# Patient Record
Sex: Female | Born: 1990
Health system: Southern US, Community
[De-identification: ages and names within clinical notes are randomized; demographics above are authoritative.]

## PROBLEM LIST (undated history)

## (undated) DIAGNOSIS — R002 Palpitations: Secondary | ICD-10-CM

---

## 2011-10-06 ENCOUNTER — Emergency Department (HOSPITAL_COMMUNITY)
Admission: EM | Admit: 2011-10-06 | Discharge: 2011-10-06 | Disposition: A | Discharge status: Home / Self Care | Payer: BC Managed Care – PPO | Attending: Emergency Medicine | Admitting: Emergency Medicine

## 2011-10-06 ENCOUNTER — Encounter (HOSPITAL_COMMUNITY): Payer: Self-pay | Admitting: *Deleted

## 2011-10-06 DIAGNOSIS — Y998 Other external cause status: Secondary | ICD-10-CM | POA: Insufficient documentation

## 2011-10-06 DIAGNOSIS — S61409A Unspecified open wound of unspecified hand, initial encounter: Secondary | ICD-10-CM | POA: Insufficient documentation

## 2011-10-06 DIAGNOSIS — IMO0002 Reserved for concepts with insufficient information to code with codable children: Secondary | ICD-10-CM

## 2011-10-06 DIAGNOSIS — S40819A Abrasion of unspecified upper arm, initial encounter: Secondary | ICD-10-CM

## 2011-10-06 DIAGNOSIS — Y9351 Activity, roller skating (inline) and skateboarding: Secondary | ICD-10-CM | POA: Insufficient documentation

## 2011-10-06 DIAGNOSIS — S40219A Abrasion of unspecified shoulder, initial encounter: Secondary | ICD-10-CM

## 2011-10-06 MED ORDER — IBUPROFEN 800 MG PO TABS: 800.0000 mg | ORAL_TABLET | Freq: Three times a day (TID) | ORAL | Status: AC | PRN

## 2011-10-06 NOTE — ED Notes (Signed)
Abrasions and skin tear lightly cleaned and dressed.

## 2011-10-06 NOTE — ED Provider Notes (Signed)
History     CSN: [id number]  Arrival date & time 10/06/11  1747   First MD Initiated Contact with Patient 10/06/11 1935      Chief Complaint  Patient presents with  . Extremity Laceration    right hand  . Abrasion    Right face, right shoulder, right elbow, right wrist  . Fall    (Consider location/radiation/quality/duration/timing/severity/associated sxs/prior treatment) HPI The patient presents to the ER with abrasions to multiple sites from a fall from a skateboard that just happened prior to arrival. The patient denies LOC, visual changes, weakness, nausea, vomiting, chest pain, SOB, dizziness, or neck pain. The patient did not take any medications prior to arrival.  History reviewed. No pertinent past medical history.  History reviewed. No pertinent past surgical history.  History reviewed. No pertinent family history.  History  Substance Use Topics  . Smoking status: Never Smoker   . Smokeless tobacco: Never Used  . Alcohol Use: No    OB History    Grav Para Term Preterm Abortions TAB SAB Ect Mult Living                  Review of Systems All other systems negative except as documented in the HPI. All pertinent positives and negatives as reviewed in the HPI.  Allergies  Review of patient's allergies indicates no known allergies.  Home Medications   Current Outpatient Rx  Name Route Sig Dispense Refill  . ADULT MULTIVITAMIN W/MINERALS CH Oral Take 1 tablet by mouth daily.      BP 114/80  Pulse 104  Temp(Src) 98.7 F (37.1 C) (Oral)  Resp 18  Wt 120 lb (54.432 kg)  SpO2 99%  LMP 09/30/2011  Physical Exam  Nursing note and vitals reviewed. Constitutional: She appears well-developed and well-nourished.  HENT:  Head: Normocephalic and atraumatic.  Neck: Normal range of motion. Neck supple.  Cardiovascular: Normal rate and regular rhythm.   Pulmonary/Chest: Effort normal and breath sounds normal.  Musculoskeletal:       Cervical back: Normal.     Arms:      Hands: Skin: Skin is warm and dry. No erythema.    ED Course  Procedures (including critical care time)  The patient is advised that the skin flap will only be left in place as a biological dressing. The patient is advised to return here as needed.Follow up with her doctor as needed. Keep abrasions clean and dry.   MDM          Carlyle Dolly, PA-C 10/18/11 1258

## 2011-10-06 NOTE — ED Notes (Signed)
Pt from home with reports of falling off skateboard onto pavement while going down a hill resulting in multiple abrasions on right side. Pt reports hitting head with right facial abrasions noted but denies LOC. Skin tear to right palm also noted, bleeding is controlled but areas noted to be oozing serosanguinous fluid.

## 2011-10-06 NOTE — ED Notes (Signed)
Cleaned w/ NS, bacitracin applied to abrasions, sutures dressed w/ xeroform drsg.

## 2011-10-06 NOTE — Discharge Instructions (Signed)
Return here as needed. Keep wounds clean and dry. The skin on your palm will eventually come off after some healing has occurred.

## 2011-10-18 NOTE — ED Provider Notes (Signed)
Medical screening examination/treatment/procedure(s) were performed by non-physician practitioner and as supervising physician I was immediately available for consultation/collaboration.   Dayton Bailiff, MD 10/18/11 2253

## 2013-06-20 ENCOUNTER — Encounter (HOSPITAL_COMMUNITY): Payer: Self-pay | Admitting: Emergency Medicine

## 2013-06-20 ENCOUNTER — Emergency Department (HOSPITAL_COMMUNITY)
Admission: EM | Admit: 2013-06-20 | Discharge: 2013-06-21 | Disposition: A | Discharge status: Home / Self Care | Payer: BC Managed Care – PPO | Attending: Emergency Medicine | Admitting: Emergency Medicine

## 2013-06-20 DIAGNOSIS — A084 Viral intestinal infection, unspecified: Secondary | ICD-10-CM

## 2013-06-20 DIAGNOSIS — Z3202 Encounter for pregnancy test, result negative: Secondary | ICD-10-CM | POA: Insufficient documentation

## 2013-06-20 DIAGNOSIS — A088 Other specified intestinal infections: Secondary | ICD-10-CM | POA: Insufficient documentation

## 2013-06-20 LAB — CBC WITH DIFFERENTIAL/PLATELET
Basophils Absolute: 0 10*3/uL (ref 0.0–0.1)
Basophils Relative: 0 % (ref 0–1)
Eosinophils Absolute: 0 10*3/uL (ref 0.0–0.7)
Eosinophils Relative: 0 % (ref 0–5)
HCT: 39.8 % (ref 36.0–46.0)
Hemoglobin: 13.6 g/dL (ref 12.0–15.0)
Lymphocytes Relative: 11 % — ABNORMAL LOW (ref 12–46)
Lymphs Abs: 1 10*3/uL (ref 0.7–4.0)
MCH: 27.9 pg (ref 26.0–34.0)
MCHC: 34.2 g/dL (ref 30.0–36.0)
MCV: 81.6 fL (ref 78.0–100.0)
Monocytes Absolute: 0.9 10*3/uL (ref 0.1–1.0)
Monocytes Relative: 10 % (ref 3–12)
Neutro Abs: 7.1 10*3/uL (ref 1.7–7.7)
Neutrophils Relative %: 79 % — ABNORMAL HIGH (ref 43–77)
Platelets: 260 10*3/uL (ref 150–400)
RBC: 4.88 MIL/uL (ref 3.87–5.11)
RDW: 13 % (ref 11.5–15.5)
WBC: 9 10*3/uL (ref 4.0–10.5)

## 2013-06-20 LAB — COMPREHENSIVE METABOLIC PANEL
ALT: 13 U/L (ref 0–35)
AST: 16 U/L (ref 0–37)
Albumin: 5 g/dL (ref 3.5–5.2)
Alkaline Phosphatase: 56 U/L (ref 39–117)
BUN: 16 mg/dL (ref 6–23)
CO2: 25 mEq/L (ref 19–32)
Calcium: 10 mg/dL (ref 8.4–10.5)
Chloride: 100 mEq/L (ref 96–112)
Creatinine, Ser: 0.84 mg/dL (ref 0.50–1.10)
GFR calc Af Amer: >90 mL/min (ref >90)
GFR calc non Af Amer: >90 mL/min (ref >90)
Glucose, Bld: 98 mg/dL (ref 70–99)
Potassium: 3.9 mEq/L (ref 3.7–5.3)
Sodium: 141 mEq/L (ref 137–147)
Total Bilirubin: 0.4 mg/dL (ref 0.3–1.2)
Total Protein: 9.1 g/dL — ABNORMAL HIGH (ref 6.0–8.3)

## 2013-06-20 LAB — URINALYSIS, ROUTINE W REFLEX MICROSCOPIC
Bilirubin Urine: NEGATIVE
Glucose, UA: NEGATIVE mg/dL
Hgb urine dipstick: NEGATIVE
Ketones, ur: 40 mg/dL — AB
Leukocytes, UA: NEGATIVE
Nitrite: NEGATIVE
Protein, ur: NEGATIVE mg/dL
Specific Gravity, Urine: 1.029 (ref 1.005–1.030)
Urobilinogen, UA: 0.2 mg/dL (ref 0.0–1.0)
pH: 6 (ref 5.0–8.0)

## 2013-06-20 LAB — POC URINE PREG, ED: Preg Test, Ur: NEGATIVE

## 2013-06-20 MED ORDER — ONDANSETRON 8 MG PO TBDP
8.0000 mg | ORAL_TABLET | Freq: Once | ORAL | Status: AC
  Administered 2013-06-20: 8 mg via ORAL
  Filled 2013-06-20: qty 1

## 2013-06-20 MED ORDER — SODIUM CHLORIDE 0.9 % IV BOLUS (SEPSIS)
500.0000 mL | Freq: Once | INTRAVENOUS | Status: AC
  Administered 2013-06-20: 500 mL via INTRAVENOUS

## 2013-06-20 NOTE — ED Notes (Signed)
Pt reports acute onset of n/v/d that began approx 0100 today - pt reports chills this a.m. As well. Pt admits to abd cramping - LNMP 06/03/2013 - pt attributes her symptoms to possible food poisoning.

## 2013-06-20 NOTE — ED Provider Notes (Signed)
CSN: [id number]     Arrival date & time 06/20/13  2120 History   First MD Initiated Contact with Patient 06/20/13 2316     Chief Complaint  Patient presents with  . Nausea  . Emesis  . Diarrhea     (Consider location/radiation/quality/duration/timing/severity/associated sxs/prior Treatment) HPI Comments: Pt is a 23 y/o healthy female who presents to the ED complaining of sudden onset nausea, vomiting and diarrhea around 1:00 am today. States she had multiple episodes of non-bloody emesis until around noon, was able to keep some food down at that time. She had multiple episodes of non-bloody diarrhea until 8:00 pm tonight. Admits to lower abdominal pain described as cramping, subjective fever and chills. Thinks she may have had food poisoning last night after eating [employer]. LMP was on 2/5 and was normal. Denies increased urinary frequency, urgency or dysuria, vaginal bleeding or discharge.  Patient is a 23 y.o. female presenting with vomiting and diarrhea. The history is provided by the patient.  Emesis Associated symptoms: abdominal pain, chills and diarrhea   Diarrhea Associated symptoms: abdominal pain, chills, fever and vomiting     History reviewed. No pertinent past medical history. History reviewed. No pertinent past surgical history. History reviewed. No pertinent family history. History  Substance Use Topics  . Smoking status: Never Smoker   . Smokeless tobacco: Never Used  . Alcohol Use: No   OB History   Grav Para Term Preterm Abortions TAB SAB Ect Mult Living                 Review of Systems  Constitutional: Positive for fever and chills.  Gastrointestinal: Positive for nausea, vomiting, abdominal pain and diarrhea.  All other systems reviewed and are negative.      Allergies  Review of patient's allergies indicates no known allergies.  Home Medications  No current outpatient prescriptions on file. BP 115/76  Pulse 93  Temp(Src) 98.4 F (36.9 C)  (Oral)  Resp 17  Ht 5\' 4"  (1.626 m)  Wt 121 lb (54.885 kg)  BMI 20.76 kg/m2  SpO2 100%  LMP 06/03/2013 Physical Exam  Nursing note and vitals reviewed. Constitutional: She is oriented to person, place, and time. She appears well-developed and well-nourished. No distress.  HENT:  Head: Normocephalic and atraumatic.  Mouth/Throat: Oropharynx is clear and moist.  Eyes: Conjunctivae are normal.  Neck: Normal range of motion. Neck supple.  Cardiovascular: Normal rate, regular rhythm and normal heart sounds.   Pulmonary/Chest: Effort normal and breath sounds normal.  Abdominal: Soft. Normal appearance and bowel sounds are normal. She exhibits no distension. There is tenderness (mild). There is no rigidity, no rebound and no guarding.    No peritoneal signs.  Musculoskeletal: Normal range of motion. She exhibits no edema.  Neurological: She is alert and oriented to person, place, and time.  Skin: Skin is warm and dry. She is not diaphoretic.  Psychiatric: She has a normal mood and affect. Her behavior is normal.    ED Course  Procedures (including critical care time) Labs Review Labs Reviewed  CBC WITH DIFFERENTIAL - Abnormal; Notable for the following:    Neutrophils Relative % 79 (*)    Lymphocytes Relative 11 (*)    All other components within normal limits  COMPREHENSIVE METABOLIC PANEL - Abnormal; Notable for the following:    Total Protein 9.1 (*)    All other components within normal limits  URINALYSIS, ROUTINE W REFLEX MICROSCOPIC - Abnormal; Notable for the following:    Ketones,  ur 40 (*)    All other components within normal limits  POC URINE PREG, ED   Imaging Review No results found.  EKG Interpretation   None       MDM   Final diagnoses:  Viral gastroenteritis   Pt presenting with n/v/d after eating [employer]. She is well appearing and in NAD, VSS. Labs obtained in triage prior to pt being seen, ketones in urine evident of mild dehydration. After  receiving 500 cc bolus, pt reports she is feeling much better. Tolerating PO. Repeat abdominal exam still mildly tender, however clinical improvement noted. Stable for d/c. Return precautions given. Patient states understanding of treatment care plan and is agreeable.   Illene Labrador, PA-C 06/21/13 [phone]

## 2013-06-21 NOTE — Discharge Instructions (Signed)
Viral Gastroenteritis Viral gastroenteritis is also known as stomach flu. This condition affects the stomach and intestinal tract. It can cause sudden diarrhea and vomiting. The illness typically lasts 3 to 8 days. Most people develop an immune response that eventually gets rid of the virus. While this natural response develops, the virus can make you quite ill. CAUSES  Many different viruses can cause gastroenteritis, such as rotavirus or noroviruses. You can catch one of these viruses by consuming contaminated food or water. You may also catch a virus by sharing utensils or other personal items with an infected person or by touching a contaminated surface. SYMPTOMS  The most common symptoms are diarrhea and vomiting. These problems can cause a severe loss of body fluids (dehydration) and a body salt (electrolyte) imbalance. Other symptoms may include:  Fever.  Headache.  Fatigue.  Abdominal pain. DIAGNOSIS  Your caregiver can usually diagnose viral gastroenteritis based on your symptoms and a physical exam. A stool sample may also be taken to test for the presence of viruses or other infections. TREATMENT  This illness typically goes away on its own. Treatments are aimed at rehydration. The most serious cases of viral gastroenteritis involve vomiting so severely that you are not able to keep fluids down. In these cases, fluids must be given through an intravenous line (IV). HOME CARE INSTRUCTIONS   Drink enough fluids to keep your urine clear or pale yellow. Drink small amounts of fluids frequently and increase the amounts as tolerated.  Ask your caregiver for specific rehydration instructions.  Avoid:  Foods high in sugar.  Alcohol.  Carbonated drinks.  Tobacco.  Juice.  Caffeine drinks.  Extremely hot or cold fluids.  Fatty, greasy foods.  Too much intake of anything at one time.  Dairy products until 24 to 48 hours after diarrhea stops.  You may consume probiotics.  Probiotics are active cultures of beneficial bacteria. They may lessen the amount and number of diarrheal stools in adults. Probiotics can be found in yogurt with active cultures and in supplements.  Wash your hands well to avoid spreading the virus.  Only take over-the-counter or prescription medicines for pain, discomfort, or fever as directed by your caregiver. Do not give aspirin to children. Antidiarrheal medicines are not recommended.  Ask your caregiver if you should continue to take your regular prescribed and over-the-counter medicines.  Keep all follow-up appointments as directed by your caregiver. SEEK IMMEDIATE MEDICAL CARE IF:   You are unable to keep fluids down.  You do not urinate at least once every 6 to 8 hours.  You develop shortness of breath.  You notice blood in your stool or vomit. This may look like coffee grounds.  You have abdominal pain that increases or is concentrated in one small area (localized).  You have persistent vomiting or diarrhea.  You have a fever.  The patient is a child younger than 3 months, and he or she has a fever.  The patient is a child older than 3 months, and he or she has a fever and persistent symptoms.  The patient is a child older than 3 months, and he or she has a fever and symptoms suddenly get worse.  The patient is a baby, and he or she has no tears when crying. MAKE SURE YOU:   Understand these instructions.  Will watch your condition.  Will get help right away if you are not doing well or get worse. Document Released: 04/15/2005 Document Revised: 07/08/2011 Document Reviewed: 01/30/2011   [employer] Patient Information 2014 [employer].  

## 2013-06-21 NOTE — ED Provider Notes (Signed)
Medical screening examination/treatment/procedure(s) were performed by non-physician practitioner and as supervising physician I was immediately available for consultation/collaboration.  EKG Interpretation   None         Hoy Morn, MD 06/21/13 [phone]

## 2014-02-24 ENCOUNTER — Emergency Department (HOSPITAL_COMMUNITY): Payer: BC Managed Care – PPO

## 2014-02-24 ENCOUNTER — Emergency Department (HOSPITAL_COMMUNITY)
Admission: EM | Admit: 2014-02-24 | Discharge: 2014-02-25 | Disposition: A | Discharge status: Home / Self Care | Payer: BC Managed Care – PPO | Attending: Emergency Medicine | Admitting: Emergency Medicine

## 2014-02-24 ENCOUNTER — Encounter (HOSPITAL_COMMUNITY): Payer: Self-pay | Admitting: Emergency Medicine

## 2014-02-24 DIAGNOSIS — R002 Palpitations: Secondary | ICD-10-CM | POA: Insufficient documentation

## 2014-02-24 DIAGNOSIS — R0789 Other chest pain: Secondary | ICD-10-CM | POA: Diagnosis not present

## 2014-02-24 DIAGNOSIS — Z3202 Encounter for pregnancy test, result negative: Secondary | ICD-10-CM | POA: Diagnosis not present

## 2014-02-24 HISTORY — DX: Palpitations: R00.2

## 2014-02-24 LAB — BASIC METABOLIC PANEL
Anion gap: 10 (ref 5–15)
BUN: 14 mg/dL (ref 6–23)
CO2: 28 mEq/L (ref 19–32)
Calcium: 9.3 mg/dL (ref 8.4–10.5)
Chloride: 100 mEq/L (ref 96–112)
Creatinine, Ser: 0.92 mg/dL (ref 0.50–1.10)
GFR calc Af Amer: >90 mL/min (ref >90)
GFR calc non Af Amer: 88 mL/min — ABNORMAL LOW (ref >90)
Glucose, Bld: 90 mg/dL (ref 70–99)
Potassium: 3.7 mEq/L (ref 3.7–5.3)
Sodium: 138 mEq/L (ref 137–147)

## 2014-02-24 LAB — CBC
HCT: 36.8 % (ref 36.0–46.0)
Hemoglobin: 12 g/dL (ref 12.0–15.0)
MCH: 27.1 pg (ref 26.0–34.0)
MCHC: 32.6 g/dL (ref 30.0–36.0)
MCV: 83.3 fL (ref 78.0–100.0)
Platelets: 223 10*3/uL (ref 150–400)
RBC: 4.42 MIL/uL (ref 3.87–5.11)
RDW: 13.3 % (ref 11.5–15.5)
WBC: 5 10*3/uL (ref 4.0–10.5)

## 2014-02-24 LAB — POC URINE PREG, ED: Preg Test, Ur: NEGATIVE

## 2014-02-24 LAB — I-STAT TROPONIN, ED: Troponin i, poc: 0.01 ng/mL (ref 0.00–0.08)

## 2014-02-24 NOTE — ED Provider Notes (Signed)
CSN: [id number]     Arrival date & time 02/24/14  2135 History   First MD Initiated Contact with Patient 02/24/14 2305     Chief Complaint  Patient presents with  . Palpitations    with central chest tightness     (Consider location/radiation/quality/duration/timing/severity/associated sxs/prior Treatment) HPI 23 yo female presents to the ER with complaint of palpitations.  She reports she has had palpitations on and off for about 4 years, but tonight had some chest tightness with the palpitations and the sensation of a racing heart.  She reports that she had just finished eating chicken tenderness at [employer] when symptoms came on.  They lasted just a few seconds.  She has a setback at this time aside from occasionally having an extra beat sensation.  Patient reports she usually has palpitations immediately after eating greasy or fried foods.  She does not have these symptoms when she eats healthy foods.  She does not normally have a fast heart rate or chest tightness.  She denies any leg swelling.  No prolonged immobilization, no exogenous hormones.  She is not a smoker.  Patient takes no medications, no medical history, no family history.  She does not ingest any caffeine. Past Medical History  Diagnosis Date  . Palpitations    History reviewed. No pertinent past surgical history. No family history on file. History  Substance Use Topics  . Smoking status: Never Smoker   . Smokeless tobacco: Never Used  . Alcohol Use: No     Comment: occ   OB History   Grav Para Term Preterm Abortions TAB SAB Ect Mult Living                 Review of Systems   See History of Present Illness; otherwise all other systems are reviewed and negative  Allergies  Review of patient's allergies indicates no known allergies.  Home Medications   Prior to Admission medications   Not on File   BP 130/64  Pulse 82  Temp(Src) 98.4 F (36.9 C) (Oral)  Resp 18  SpO2 100%  LMP 02/17/2014 Physical  Exam  Nursing note and vitals reviewed. Constitutional: She is oriented to person, place, and time. She appears well-developed and well-nourished.  HENT:  Head: Normocephalic and atraumatic.  Nose: Nose normal.  Mouth/Throat: Oropharynx is clear and moist.  Eyes: Conjunctivae and EOM are normal. Pupils are equal, round, and reactive to light.  Neck: Normal range of motion. Neck supple. No JVD present. No tracheal deviation present. No thyromegaly present.  Cardiovascular: Normal rate, regular rhythm, normal heart sounds and intact distal pulses.  Exam reveals no gallop and no friction rub.   No murmur heard. Pulmonary/Chest: Effort normal and breath sounds normal. No stridor. No respiratory distress. She has no wheezes. She has no rales. She exhibits no tenderness.  Abdominal: Soft. Bowel sounds are normal. She exhibits no distension and no mass. There is no tenderness. There is no rebound and no guarding.  Musculoskeletal: Normal range of motion. She exhibits no edema and no tenderness.  Lymphadenopathy:    She has no cervical adenopathy.  Neurological: She is alert and oriented to person, place, and time. She displays normal reflexes. She exhibits normal muscle tone. Coordination normal.  Skin: Skin is warm and dry. No rash noted. No erythema. No pallor.  Psychiatric: She has a normal mood and affect. Her behavior is normal. Judgment and thought content normal.    ED Course  Procedures (including critical care time)  Labs Review Labs Reviewed  BASIC METABOLIC PANEL - Abnormal; Notable for the following:    GFR calc non Af Amer 88 (*)    All other components within normal limits  CBC  I-STAT TROPOININ, ED  POC URINE PREG, ED    Imaging Review Dg Chest 2 View  02/24/2014   CLINICAL DATA:  Palpitations, chest tightness, shortness of breath  EXAM: CHEST  2 VIEW  COMPARISON:  None.  FINDINGS: The heart size and mediastinal contours are within normal limits. Both lungs are clear. The  visualized skeletal structures are unremarkable.  IMPRESSION: No active cardiopulmonary disease.   Electronically Signed   By: Kathreen Devoid   On: 02/24/2014 22:37     EKG Interpretation   Date/Time:  Thursday February 24 2014 21:41:01 EDT Ventricular Rate:  85 PR Interval:  146 QRS Duration: 87 QT Interval:  340 QTC Calculation: 404 R Axis:   57 Text Interpretation:  Sinus rhythm RSR' in V1 or V2, right VCD or RVH  Nonspecific T abnormalities, anterior leads No old tracing to compare  Confirmed by Almendra Loria  MD, Tavaughn Silguero ([id number]) on 02/24/2014 11:08:24 PM      MDM   Final diagnoses:  Heart palpitations    23 year old female with palpitations.  Workup here unremarkable.  Advise patient that she should avoid foods that trigger her palpitations if they are an annoyance.  Should they increase or worsen, I have given her follow-up information with cardiology as needed.  I have suggested that she try Zantac or Pepcid to see if that helps with her symptoms.    Kalman Drape, MD 02/25/14 [phone]

## 2014-02-24 NOTE — ED Notes (Addendum)
Patient c/o palpitations with central chest tightness. Patient report history of palpitations, but never with chest pain, states this is difference. VSS. Patient denies taking any medications for this problem PTA. Patient states she was eating when her symptoms started ([employer]).

## 2014-02-24 NOTE — Discharge Instructions (Signed)
You can try over the counter acid reducing medication such as zantac or pepcid if you notice your symptoms are increasing when you are eating/after you eat.  Follow up with your doctor or with the cardiology office listed above.     Palpitations A palpitation is the feeling that your heartbeat is irregular or is faster than normal. It may feel like your heart is fluttering or skipping a beat. Palpitations are usually not a serious problem. However, in some cases, you may need further medical evaluation. CAUSES  Palpitations can be caused by:  Smoking.  Caffeine or other stimulants, such as diet pills or energy drinks.  Alcohol.  Stress and anxiety.  Strenuous physical activity.  Fatigue.  Certain medicines.  Heart disease, especially if you have a history of irregular heart rhythms (arrhythmias), such as atrial fibrillation, atrial flutter, or supraventricular tachycardia.  An improperly working pacemaker or defibrillator. DIAGNOSIS  To find the cause of your palpitations, your health care provider will take your medical history and perform a physical exam. Your health care provider may also have you take a test called an ambulatory electrocardiogram (ECG). An ECG records your heartbeat patterns over a 24-hour period. You may also have other tests, such as:  Transthoracic echocardiogram (TTE). During echocardiography, sound waves are used to evaluate how blood flows through your heart.  Transesophageal echocardiogram (TEE).  Cardiac monitoring. This allows your health care provider to monitor your heart rate and rhythm in real time.  Holter monitor. This is a portable device that records your heartbeat and can help diagnose heart arrhythmias. It allows your health care provider to track your heart activity for several days, if needed.  Stress tests by exercise or by giving medicine that makes the heart beat faster. TREATMENT  Treatment of palpitations depends on the cause of  your symptoms and can vary greatly. Most cases of palpitations do not require any treatment other than time, relaxation, and monitoring your symptoms. Other causes, such as atrial fibrillation, atrial flutter, or supraventricular tachycardia, usually require further treatment. HOME CARE INSTRUCTIONS   Avoid:  Caffeinated coffee, tea, soft drinks, diet pills, and energy drinks.  Chocolate.  Alcohol.  Stop smoking if you smoke.  Reduce your stress and anxiety. Things that can help you relax include:  A method of controlling things in your body, such as your heartbeats, with your mind (biofeedback).  Yoga.  Meditation.  Physical activity such as swimming, jogging, or walking.  Get plenty of rest and sleep. SEEK MEDICAL CARE IF:   You continue to have a fast or irregular heartbeat beyond 24 hours.  Your palpitations occur more often. SEEK IMMEDIATE MEDICAL CARE IF:  You have chest pain or shortness of breath.  You have a severe headache.  You feel dizzy or you faint. MAKE SURE YOU:  Understand these instructions.  Will watch your condition.  Will get help right away if you are not doing well or get worse. Document Released: 04/12/2000 Document Revised: 04/20/2013 Document Reviewed: 06/14/2011 [facility] Patient Information 2015 [place], [place]. This information is not intended to replace advice given to you by your health care provider. Make sure you discuss any questions you have with your health care provider.

## 2014-07-25 ENCOUNTER — Encounter (HOSPITAL_COMMUNITY): Payer: Self-pay | Admitting: Emergency Medicine

## 2014-07-25 ENCOUNTER — Emergency Department (HOSPITAL_COMMUNITY)
Admission: EM | Admit: 2014-07-25 | Discharge: 2014-07-25 | Disposition: A | Discharge status: Home / Self Care | Payer: BLUE CROSS/BLUE SHIELD | Attending: Emergency Medicine | Admitting: Emergency Medicine

## 2014-07-25 DIAGNOSIS — J029 Acute pharyngitis, unspecified: Secondary | ICD-10-CM | POA: Insufficient documentation

## 2014-07-25 DIAGNOSIS — R52 Pain, unspecified: Secondary | ICD-10-CM | POA: Insufficient documentation

## 2014-07-25 LAB — RAPID STREP SCREEN (MED CTR MEBANE ONLY): Streptococcus, Group A Screen (Direct): NEGATIVE

## 2014-07-25 MED ORDER — ACETAMINOPHEN 500 MG PO TABS: 500.0000 mg | ORAL_TABLET | Freq: Four times a day (QID) | ORAL | Status: DC | PRN

## 2014-07-25 MED ORDER — IBUPROFEN 600 MG PO TABS: 600.0000 mg | ORAL_TABLET | Freq: Four times a day (QID) | ORAL | Status: DC | PRN

## 2014-07-25 NOTE — ED Notes (Signed)
Pt c/o sore throat, pt first checked in asking for doctors note but was told unable to just give out doctors note.

## 2014-07-25 NOTE — ED Provider Notes (Signed)
CSN: [id number]     Arrival date & time 07/25/14  1013 History   First MD Initiated Contact with Patient 07/25/14 1020     Chief Complaint  Patient presents with  . Sore Throat     (Consider location/radiation/quality/duration/timing/severity/associated sxs/prior Treatment) HPI  Pt is a 24yo female c/o gradually worsening sore throat that started 2 days ago with associated nausea, body aches, and subjective fever with hot and cold chills. Pt states throat pain is 6/10 at worse. She has taken benadryl w/o relief.  Pt denies taking tylenol or motrin for pain.  Denies difficulty breathing or swallowing. Reports mild nasal congestion but denies cough or SOB.  Denies sick contacts or recent travel. No other significant PMH.   Past Medical History  Diagnosis Date  . Palpitations    History reviewed. No pertinent past surgical history. No family history on file. History  Substance Use Topics  . Smoking status: Never Smoker   . Smokeless tobacco: Never Used  . Alcohol Use: No     Comment: occ   OB History    No data available     Review of Systems  Constitutional: Positive for fever (subjective) and chills. Negative for appetite change.  HENT: Positive for congestion ( mild) and sore throat. Negative for ear pain, facial swelling, trouble swallowing and voice change.   Respiratory: Negative for cough and shortness of breath.   Cardiovascular: Negative for chest pain, palpitations and leg swelling.  Gastrointestinal: Positive for nausea. Negative for vomiting, abdominal pain and diarrhea.  Musculoskeletal: Positive for myalgias. Negative for back pain and arthralgias.  All other systems reviewed and are negative.     Allergies  Review of patient's allergies indicates no known allergies.  Home Medications   Prior to Admission medications   Medication Sig Start Date End Date Taking? Authorizing Provider  acetaminophen (TYLENOL) 500 MG tablet Take 1 tablet (500 mg total) by mouth  every 6 (six) hours as needed. 07/25/14   Noland Fordyce, PA-C  ibuprofen (ADVIL,MOTRIN) 600 MG tablet Take 1 tablet (600 mg total) by mouth every 6 (six) hours as needed. 07/25/14   Noland Fordyce, PA-C   BP 109/76 mmHg  Pulse 78  Temp(Src) 98.7 F (37.1 C) (Oral)  Resp 16  SpO2 97% Physical Exam  Constitutional: She appears well-developed and well-nourished. No distress.  HENT:  Head: Normocephalic and atraumatic.  Right Ear: Hearing, tympanic membrane, external ear and ear canal normal.  Left Ear: Hearing, tympanic membrane, external ear and ear canal normal.  Nose: Nose normal. Right sinus exhibits no maxillary sinus tenderness and no frontal sinus tenderness. Left sinus exhibits no maxillary sinus tenderness and no frontal sinus tenderness.  Mouth/Throat: Uvula is midline and mucous membranes are normal. No trismus in the jaw. Posterior oropharyngeal edema ( mild, bilaterally) and posterior oropharyngeal erythema present. No oropharyngeal exudate or tonsillar abscesses.  Eyes: Conjunctivae are normal. No scleral icterus.  Neck: Normal range of motion. Neck supple.  Cardiovascular: Normal rate, regular rhythm and normal heart sounds.   Pulmonary/Chest: Effort normal and breath sounds normal. No stridor. No respiratory distress. She has no wheezes. She has no rales. She exhibits no tenderness.  Abdominal: Soft. Bowel sounds are normal. She exhibits no distension and no mass. There is no tenderness. There is no rebound and no guarding.  Musculoskeletal: Normal range of motion.  Lymphadenopathy:    She has no cervical adenopathy.  Neurological: She is alert.  Skin: Skin is warm and dry. She is not  diaphoretic.  Nursing note and vitals reviewed.   ED Course  Procedures (including critical care time) Labs Review Labs Reviewed  RAPID STREP SCREEN  CULTURE, GROUP A STREP    Imaging Review No results found.   EKG Interpretation None      MDM   Final diagnoses:  Sore throat   Body aches    Pt is a 25yo female c/o a sore throat, myalgias, nausea, and subjective fever. No SOB. No difficulty breathing or swallowing.  Pt does have mild bilateral tonsillar edema with erythema, no exudates. No tonsillar abscess present.  Rapid strep: negative Culture pending. Pt discharged home with home care instructions. Advised ptto use acetaminophen and ibuprofen as needed for fever and pain. Encouraged rest and fluids. Return precautions provided. Pt verbalized understanding and agreement with tx plan.       Noland Fordyce, PA-C 07/25/14 1132  Milton Ferguson, MD 07/26/14 [phone]

## 2014-07-25 NOTE — ED Notes (Signed)
Bed: WTR6 Expected date:  Expected time:  Means of arrival:  Comments: 

## 2014-07-27 LAB — CULTURE, GROUP A STREP: Strep A Culture: NEGATIVE

## 2014-09-13 ENCOUNTER — Encounter (HOSPITAL_COMMUNITY): Payer: Self-pay

## 2014-09-13 ENCOUNTER — Emergency Department (HOSPITAL_COMMUNITY)
Admission: EM | Admit: 2014-09-13 | Discharge: 2014-09-13 | Discharge status: Against Medical Advice | Payer: BLUE CROSS/BLUE SHIELD | Attending: Emergency Medicine | Admitting: Emergency Medicine

## 2014-09-13 DIAGNOSIS — R197 Diarrhea, unspecified: Secondary | ICD-10-CM | POA: Diagnosis not present

## 2014-09-13 DIAGNOSIS — R079 Chest pain, unspecified: Secondary | ICD-10-CM | POA: Diagnosis not present

## 2014-09-13 DIAGNOSIS — R0602 Shortness of breath: Secondary | ICD-10-CM | POA: Insufficient documentation

## 2014-09-13 NOTE — ED Notes (Addendum)
Pt presents with c/o chest pain and shortness of breath. Pt reports this started when she was laying down approx one hour ago and she felt like her heart was beating fast. Pt's HR 76 in triage at this time. Pt also reports 2 episodes of diarrhea.

## 2014-09-13 NOTE — ED Notes (Signed)
Patient states she does not wish to wait any longer. States she is leaving.

## 2015-06-15 IMAGING — CR DG CHEST 2V
2 series · 2 of 2 positions shown · non-contrast
Comparison: None.

CLINICAL DATA: Palpitations, chest tightness, shortness of breath

EXAM:
CHEST  2 VIEW

[w chest pa]
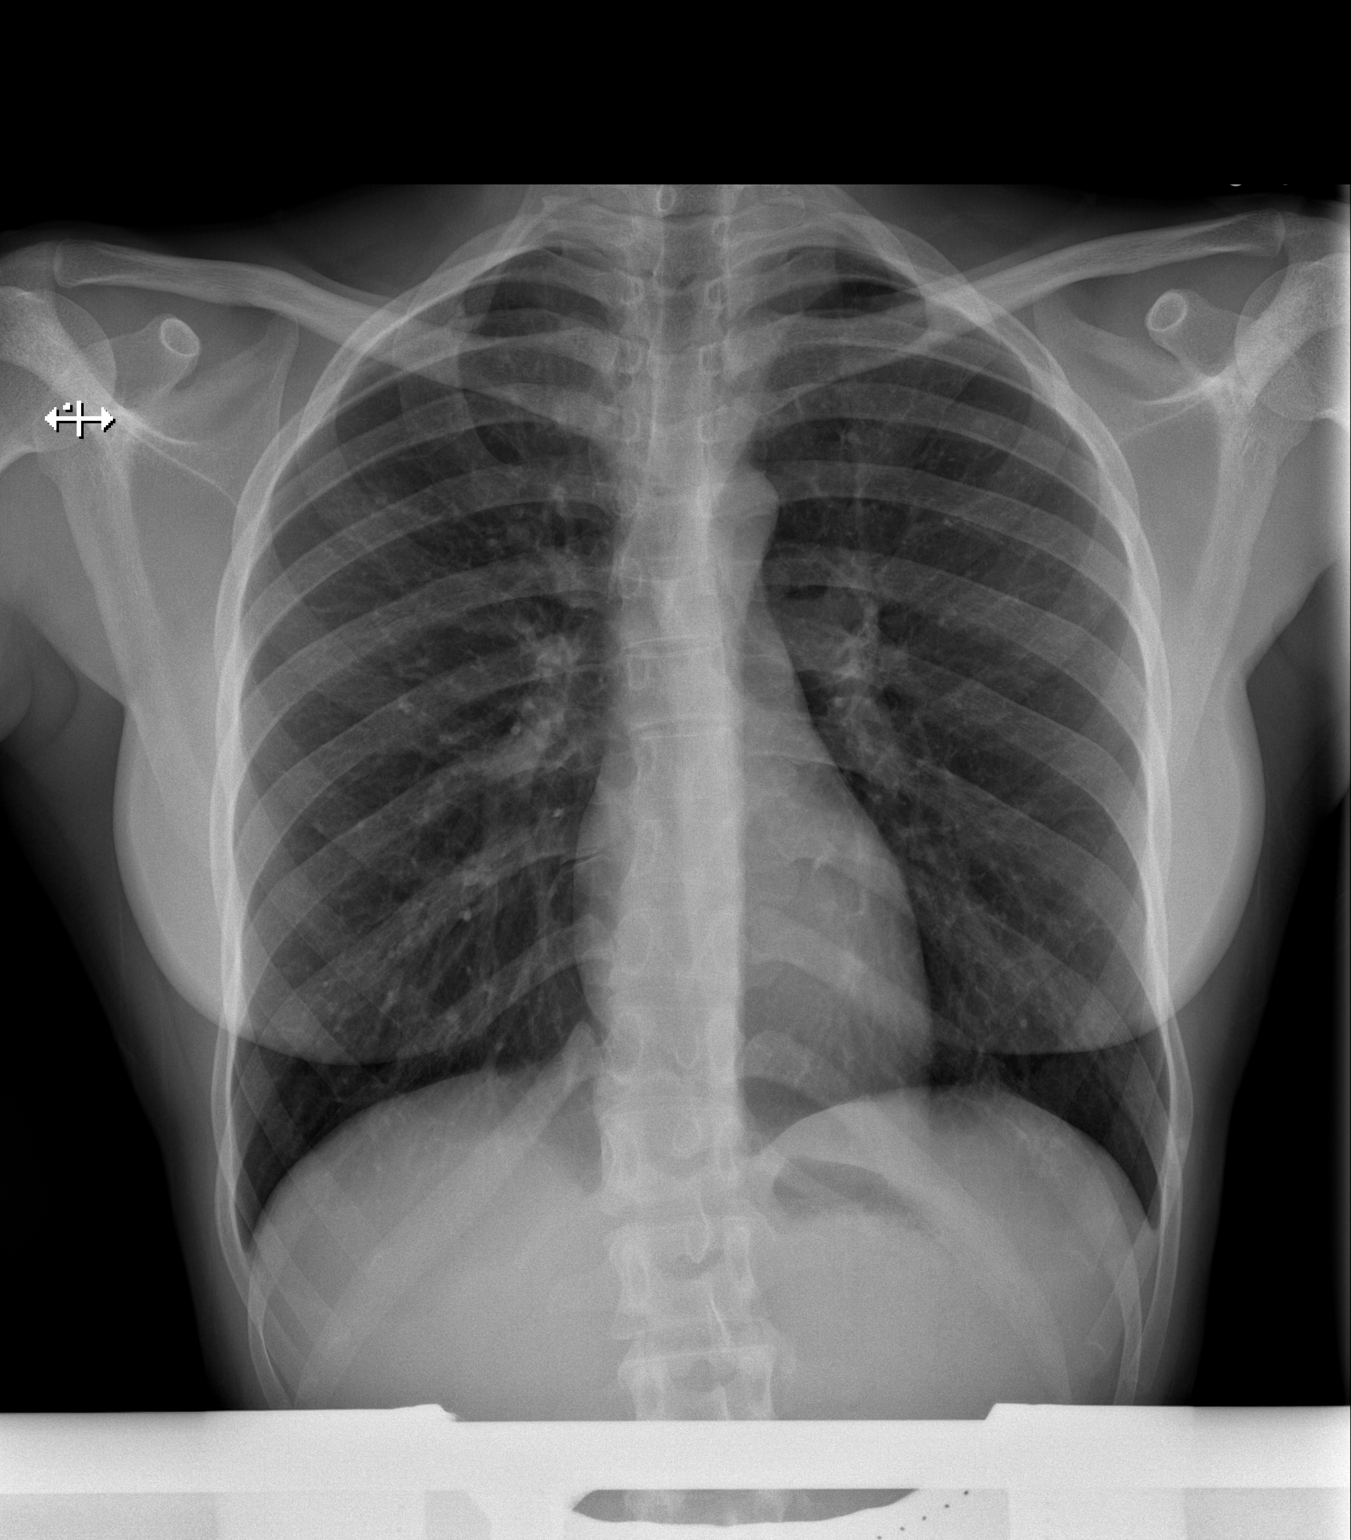

[w chest lat]
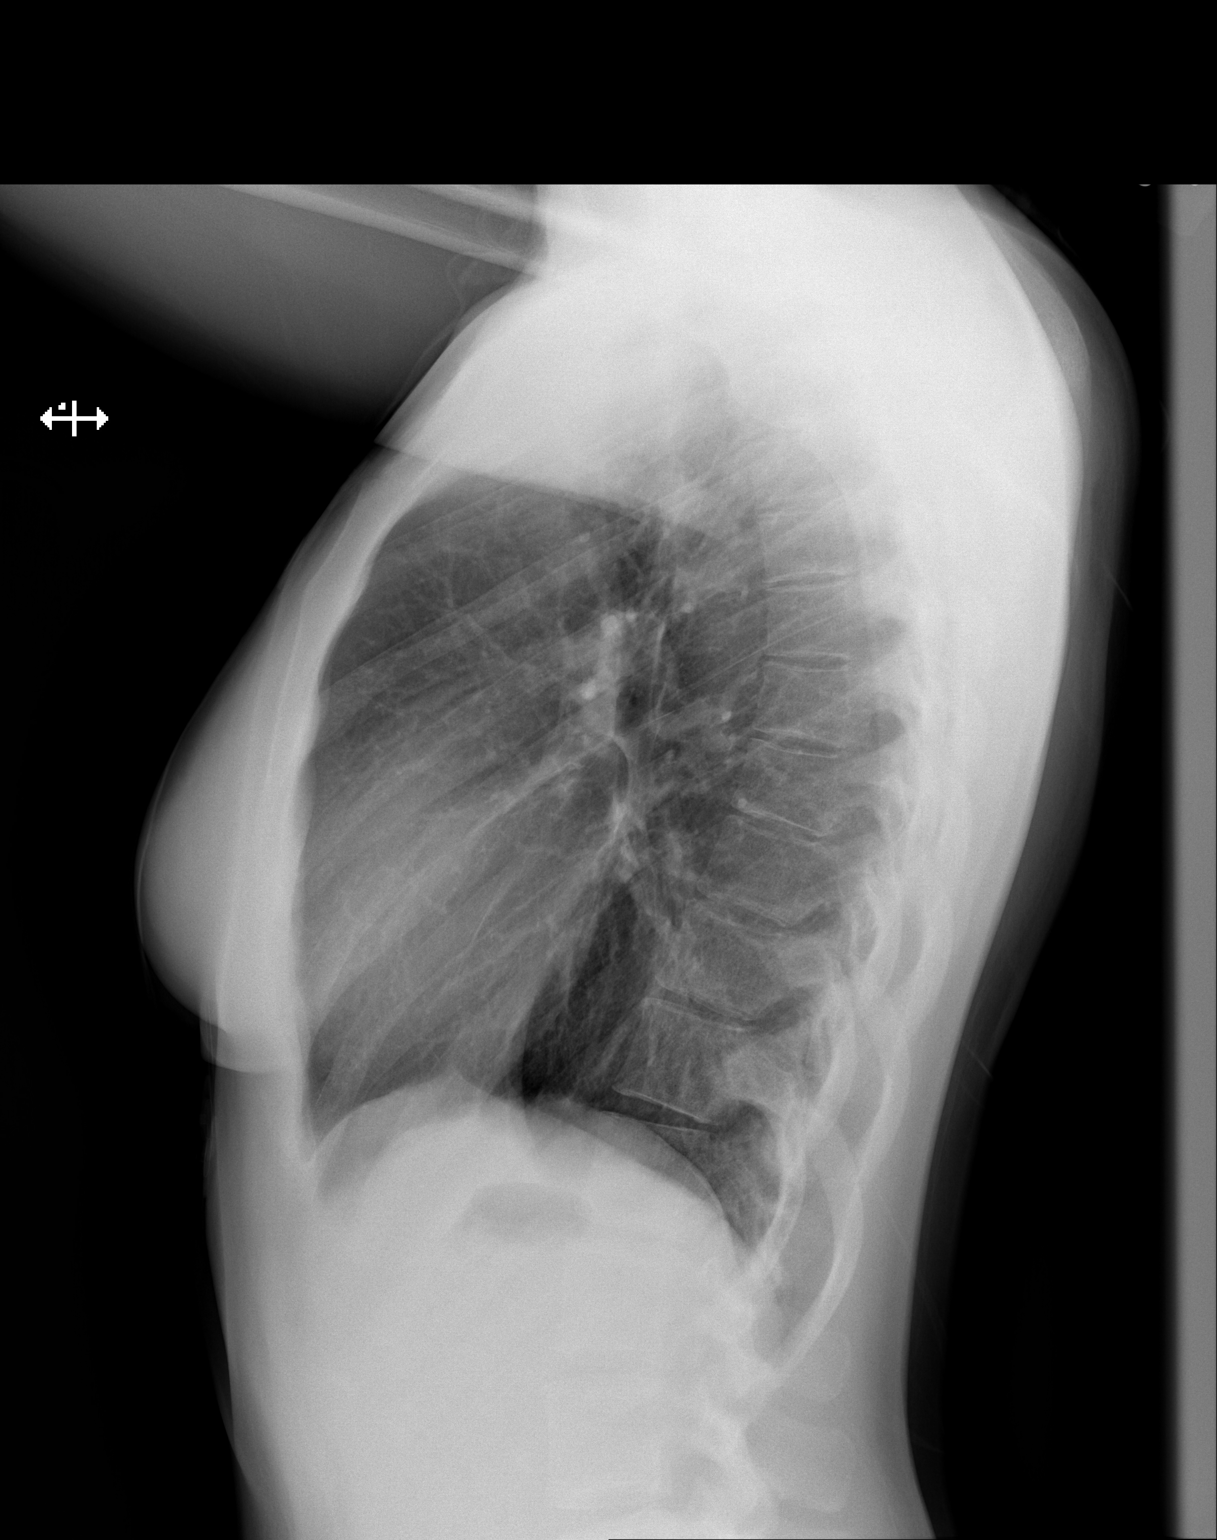

[2 of 2 positions shown; findings below may reference images not displayed]

FINDINGS: The heart size and mediastinal contours are within normal limits.
Both lungs are clear. The visualized skeletal structures are
unremarkable.
IMPRESSION: No active cardiopulmonary disease.

## 2017-02-10 DIAGNOSIS — L700 Acne vulgaris: Secondary | ICD-10-CM | POA: Diagnosis not present

## 2017-08-06 DIAGNOSIS — Z13228 Encounter for screening for other metabolic disorders: Secondary | ICD-10-CM | POA: Diagnosis not present

## 2017-08-06 DIAGNOSIS — K219 Gastro-esophageal reflux disease without esophagitis: Secondary | ICD-10-CM | POA: Diagnosis not present

## 2017-08-06 LAB — BASIC METABOLIC PANEL
BUN: 13 (ref 4–21)
Creatinine: 0.9 (ref <=1.1)
Glucose: 76
Potassium: 4.8 (ref 3.4–5.3)
Sodium: 141 (ref 137–147)

## 2017-08-06 LAB — CBC AND DIFFERENTIAL
HCT: 39 (ref 36–46)
Hemoglobin: 12.5 (ref 12.0–16.0)
Platelets: 238 (ref 150–399)

## 2017-08-06 LAB — VITAMIN D 25 HYDROXY (VIT D DEFICIENCY, FRACTURES): Vit D, 25-Hydroxy: 23.4

## 2017-08-06 LAB — TSH: TSH: 0.89 (ref <=5.90)

## 2017-08-06 LAB — LIPID PANEL
Cholesterol: 135 (ref 0–200)
HDL: 66 (ref 35–70)
LDL Cholesterol: 59
LDl/HDL Ratio: 0.9
Triglycerides: 51 (ref 40–160)

## 2018-02-03 ENCOUNTER — Encounter: Payer: Self-pay | Admitting: Nurse Practitioner

## 2018-02-03 DIAGNOSIS — K219 Gastro-esophageal reflux disease without esophagitis: Secondary | ICD-10-CM

## 2018-02-11 ENCOUNTER — Ambulatory Visit: Payer: BLUE CROSS/BLUE SHIELD | Admitting: Nurse Practitioner

## 2018-02-11 ENCOUNTER — Encounter: Payer: Self-pay | Admitting: Nurse Practitioner

## 2018-02-11 VITALS — BP 122/78 | HR 62 | Temp 98.1°F | Ht 63.25 in | Wt 125.4 lb

## 2018-02-11 DIAGNOSIS — E559 Vitamin D deficiency, unspecified: Secondary | ICD-10-CM

## 2018-02-11 DIAGNOSIS — Z0000 Encounter for general adult medical examination without abnormal findings: Secondary | ICD-10-CM

## 2018-02-11 DIAGNOSIS — Z87898 Personal history of other specified conditions: Secondary | ICD-10-CM

## 2018-02-11 DIAGNOSIS — R002 Palpitations: Secondary | ICD-10-CM | POA: Diagnosis not present

## 2018-02-11 LAB — POCT URINALYSIS DIPSTICK
Bilirubin, UA: NEGATIVE
Blood, UA: NEGATIVE
Glucose, UA: NEGATIVE
Ketones, UA: NEGATIVE
Leukocytes, UA: NEGATIVE
Nitrite, UA: NEGATIVE
Protein, UA: NEGATIVE
Spec Grav, UA: 1.015 (ref 1.010–1.025)
Urobilinogen, UA: 0.2 E.U./dL
pH, UA: 7.5 (ref 5.0–8.0)

## 2018-02-11 NOTE — Patient Instructions (Addendum)

## 2018-02-11 NOTE — Progress Notes (Addendum)
Subjective:     Patient ID: Kathy Richmond , female    DOB: 1991/04/10 , 27 y.o.   MRN: [id number]  Annual wellness   She reports she will have palpitations when she eats foods that contain increased amounts of salt.  She has had a work up in the past for palpitations.  Denies chest pain or shortness of breath.     The patient states she uses none for birth control. Last LMP was on 02/03/18  No LMP recorded..Negative for Dysmenorrhea and Negative for Menorrhagia. Negative for: breast discharge, breast lump(s), breast pain and breast self exam. Associated symptoms include abnormal vaginal bleeding. Pertinent negatives include abnormal bleeding (hematology), anxiety, decreased libido, depression, difficulty falling sleep, dyspareunia, history of infertility, nocturia, sexual dysfunction, sleep disturbances, urinary incontinence, urinary urgency, vaginal discharge and vaginal itching. Diet regular.The patient states her exercise level is   5 days   . The patient's tobacco use is:  Social History   Tobacco Use  Smoking Status Never Smoker  Smokeless Tobacco Never Used  . She has been exposed to passive smoke. The patient's alcohol use is:  Social History   Substance and Sexual Activity  Alcohol Use No   Comment: occ  . Additional information: Last pap never (never having intercourse), next one scheduled for .  Past Medical History:  Diagnosis Date  . Palpitations       Current Outpatient Medications:  .  ibuprofen (ADVIL,MOTRIN) 600 MG tablet, Take 1 tablet (600 mg total) by mouth every 6 (six) hours as needed., Disp: 30 tablet, Rfl: 0   No Known Allergies   Review of Systems  Constitutional: Negative.   HENT: Negative.   Eyes: Negative.   Respiratory: Negative.   Cardiovascular: Negative.   Gastrointestinal: Negative.   Endocrine: Negative.   Genitourinary: Negative.   Musculoskeletal: Negative.   Skin: Negative.   Allergic/Immunologic: Negative.   Neurological: Negative.    Hematological: Negative.   Psychiatric/Behavioral: Negative.      Today's Vitals   02/11/18 1011  BP: 122/78  Pulse: 62  Temp: 98.1 F (36.7 C)  TempSrc: Oral  SpO2: 96%  Weight: 125 lb 6.4 oz (56.9 kg)  Height: 5' 3.25" (1.607 m)   Body mass index is 22.04 kg/m.   Objective:  Physical Exam  Constitutional: She is oriented to person, place, and time. She appears well-developed and well-nourished.  HENT:  Head: Normocephalic and atraumatic.  Right Ear: External ear normal.  Left Ear: External ear normal.  Nose: Nose normal.  Mouth/Throat: Oropharynx is clear and moist.  Eyes: Pupils are equal, round, and reactive to light. Conjunctivae and EOM are normal.  Neck: Normal range of motion. Neck supple.  Cardiovascular: Normal rate, regular rhythm, normal heart sounds and intact distal pulses.  Pulmonary/Chest: Effort normal and breath sounds normal.  Abdominal: Soft. Bowel sounds are normal.  Musculoskeletal: Normal range of motion.  Neurological: She is alert and oriented to person, place, and time.  Skin: Skin is warm and dry.  Psychiatric: She has a normal mood and affect. Her behavior is normal. Judgment and thought content normal.        Assessment And Plan:     1. Health maintenance examination  Behavior modifications discussed and diet history reviewed.    Pt will continue to exercise regularly and modify diet with low GI, plant based foods and decrease intake of processed foods.   Recommend intake of daily multivitamin, Vitamin D, and calcium.   recommend immunizations that include influenza (  declined), TDAP,   She is not sexually active, no PAP done.  Advised if begin to become sexually active we need to do a PAP - CBC no Diff - BMP8+eGFR - Lipid Profile  2. History of palpitations  Occurs when she is eating high salt foods.  Encouraged to stay well hydrated  Previous ECG done in 2015 revealed NSR HR 85.    Encouraged to continue to avoid  caffeine and high salt foods.  3. Vitamin D deficiency  Will check vitamin d level  Continue with supplement pending lab results. - Vitamin D 1,25 Dihydroxy      Minette Brine, FNP

## 2018-11-16 ENCOUNTER — Other Ambulatory Visit: Payer: Self-pay

## 2018-11-16 ENCOUNTER — Ambulatory Visit (INDEPENDENT_AMBULATORY_CARE_PROVIDER_SITE_OTHER): Payer: BC Managed Care – PPO | Admitting: Nurse Practitioner

## 2018-11-16 ENCOUNTER — Encounter: Payer: Self-pay | Admitting: Nurse Practitioner

## 2018-11-16 VITALS — BP 110/68 | HR 85 | Temp 98.7°F | Ht 63.0 in | Wt 117.6 lb

## 2018-11-16 DIAGNOSIS — M5441 Lumbago with sciatica, right side: Secondary | ICD-10-CM

## 2018-11-16 DIAGNOSIS — D229 Melanocytic nevi, unspecified: Secondary | ICD-10-CM | POA: Diagnosis not present

## 2018-11-16 MED ORDER — DICLOFENAC SODIUM 1 % TD GEL: 2.0000 g | Freq: Four times a day (QID) | TRANSDERMAL | 2 refills | Status: DC

## 2018-11-16 NOTE — Patient Instructions (Addendum)

## 2018-11-16 NOTE — Progress Notes (Signed)
  Subjective:     Patient ID: Kathy Richmond , female    DOB: [birth year] , 28 y.o.   MRN: [id number]   Chief Complaint  Patient presents with  . Back Pain    Patient stated her back has been hurting since saturday. she states the pain is a dull constant ache that radiates down to her glutes    HPI  Back Pain This is a new problem. The current episode started in the past 7 days. The problem occurs constantly (worse when leaning forward). The pain is present in the gluteal and lumbar spine. Quality: dull pain. Radiates to: right buttocks. The pain is at a severity of 3/10 (at its worse 8/10). The pain is mild. The symptoms are aggravated by position. Pertinent negatives include no abdominal pain, chest pain, headaches or leg pain. She has tried NSAIDs for the symptoms. The treatment provided mild relief.     Past Medical History:  Diagnosis Date  . Palpitations      No family history on file.  No current outpatient medications on file.   No Known Allergies   Review of Systems  Constitutional: Negative.   Respiratory: Negative.   Cardiovascular: Negative for chest pain, palpitations and leg swelling.  Gastrointestinal: Negative for abdominal pain.  Musculoskeletal: Positive for back pain.  Neurological: Negative for dizziness and headaches.     Today's Vitals   11/16/18 1447  BP: 110/68  Pulse: 85  Temp: 98.7 F (37.1 C)  TempSrc: Oral  Weight: 117 lb 9.6 oz (53.3 kg)  Height: 5\' 3"  (1.6 m)  PainSc: 4   PainLoc: Back   Body mass index is 20.83 kg/m.   Objective:  Physical Exam Vitals signs reviewed.  Constitutional:      Appearance: Normal appearance.  Cardiovascular:     Rate and Rhythm: Normal rate and regular rhythm.     Pulses: Normal pulses.     Heart sounds: Normal heart sounds. No murmur.  Pulmonary:     Effort: Pulmonary effort is normal.     Breath sounds: Normal breath sounds.  Musculoskeletal:        General: Tenderness (right lower back  tenderness, negative straight leg raise) present. No swelling, deformity or signs of injury.     Comments: Her movements are guarded  Skin:    General: Skin is warm and dry.     Capillary Refill: Capillary refill takes less than 2 seconds.  Neurological:     Mental Status: She is alert.         Assessment And Plan:     1. Acute right-sided low back pain with right-sided sciatica  Tenderness to right lower back   Negative straight leg raise  Will provide pain cream   - diclofenac sodium (VOLTAREN) 1 % GEL; Apply 2 g topically 4 (four) times daily.  Dispense: 100 g; Refill: 2  2. Multiple nevi  She has multiple nevi to her chest and neck area that she would like to be evaluated, reports her mother has a history of multiple nevi. - Ambulatory referral to Dermatology   Minette Brine, FNP    THE PATIENT IS ENCOURAGED TO PRACTICE SOCIAL DISTANCING DUE TO THE COVID-19 PANDEMIC.

## 2018-12-07 DIAGNOSIS — R21 Rash and other nonspecific skin eruption: Secondary | ICD-10-CM | POA: Diagnosis not present

## 2019-02-15 ENCOUNTER — Ambulatory Visit (INDEPENDENT_AMBULATORY_CARE_PROVIDER_SITE_OTHER): Payer: BC Managed Care – PPO | Admitting: Nurse Practitioner

## 2019-02-15 ENCOUNTER — Other Ambulatory Visit: Payer: Self-pay

## 2019-02-15 ENCOUNTER — Encounter: Payer: Self-pay | Admitting: Nurse Practitioner

## 2019-02-15 VITALS — BP 118/72 | HR 77 | Temp 98.5°F | Ht 63.0 in | Wt 117.4 lb

## 2019-02-15 DIAGNOSIS — Z0000 Encounter for general adult medical examination without abnormal findings: Secondary | ICD-10-CM | POA: Diagnosis not present

## 2019-02-15 DIAGNOSIS — E559 Vitamin D deficiency, unspecified: Secondary | ICD-10-CM

## 2019-02-15 LAB — POCT URINALYSIS DIPSTICK
Bilirubin, UA: NEGATIVE
Glucose, UA: NEGATIVE
Ketones, UA: NEGATIVE
Leukocytes, UA: NEGATIVE
Nitrite, UA: NEGATIVE
Protein, UA: POSITIVE — AB
Spec Grav, UA: 1.02 (ref 1.010–1.025)
Urobilinogen, UA: 0.2 E.U./dL
pH, UA: 6 (ref 5.0–8.0)

## 2019-02-15 NOTE — Progress Notes (Signed)
Subjective:     Patient ID: Kathy Richmond , female    DOB: [birth year] , 28 y.o.   MRN: [id number]  Annual wellness   Here for HM.      The patient states she uses none for birth control. Last LMP was on 02/15/2019.  Patient's last menstrual period was 02/14/2019 (exact date)..Negative for Dysmenorrhea and Negative for Menorrhagia. Negative for: breast discharge, breast lump(s), breast pain and breast self exam. Associated symptoms include abnormal vaginal bleeding. Pertinent negatives include abnormal bleeding (hematology), anxiety, decreased libido, depression, difficulty falling sleep, dyspareunia, history of infertility, nocturia, sexual dysfunction, sleep disturbances, urinary incontinence, urinary urgency, vaginal discharge and vaginal itching. Diet Vegan. The patient states her exercise level is  none due to Covid pandemic.    . The patient's tobacco use is:  Social History   Tobacco Use  Smoking Status Never Smoker  Smokeless Tobacco Never Used   She has been exposed to passive smoke. The patient's alcohol use is:  Social History   Substance and Sexual Activity  Alcohol Use No   Comment: occ   Additional information: Last pap never (never having intercourse), next one scheduled for .  Past Medical History:  Diagnosis Date  . Palpitations      No current outpatient medications on file.   No Known Allergies   Review of Systems  Constitutional: Negative.   HENT: Negative.   Eyes: Negative.   Respiratory: Negative.   Cardiovascular: Negative.   Gastrointestinal: Negative.   Endocrine: Negative.   Genitourinary: Negative.   Musculoskeletal: Negative.   Skin: Negative.   Allergic/Immunologic: Negative.   Neurological: Negative.   Hematological: Negative.   Psychiatric/Behavioral: Negative.      Today's Vitals   02/15/19 1005  BP: 118/72  Pulse: 77  Temp: 98.5 F (36.9 C)  TempSrc: Oral  Weight: 117 lb 6.4 oz (53.3 kg)  Height: _0  (1.6 m)   Body  mass index is 20.8 kg/m.   Objective:  Physical Exam Constitutional:      Appearance: She is well-developed.  HENT:     Head: Normocephalic and atraumatic.     Right Ear: External ear normal.     Left Ear: External ear normal.     Nose: Nose normal.  Eyes:     Conjunctiva/sclera: Conjunctivae normal.     Pupils: Pupils are equal, round, and reactive to light.  Neck:     Musculoskeletal: Normal range of motion and neck supple.  Cardiovascular:     Rate and Rhythm: Normal rate and regular rhythm.     Heart sounds: Normal heart sounds.  Pulmonary:     Effort: Pulmonary effort is normal.     Breath sounds: Normal breath sounds.  Chest:     Breasts:        Right: Normal. No mass, nipple discharge or tenderness.        Left: Normal. No mass, nipple discharge or tenderness.  Abdominal:     General: Bowel sounds are normal.     Palpations: Abdomen is soft.  Musculoskeletal: Normal range of motion.  Lymphadenopathy:     Upper Body:     Right upper body: No axillary or pectoral adenopathy.     Left upper body: No axillary or pectoral adenopathy.  Skin:    General: Skin is warm and dry.  Neurological:     Mental Status: She is alert and oriented to person, place, and time.  Psychiatric:        Behavior: Behavior  normal.        Thought Content: Thought content normal.        Judgment: Judgment normal.         Assessment And Plan:     1. Health maintenance examination  Behavior modifications discussed and diet history reviewed.    Pt will continue to exercise regularly and modify diet with low GI, plant based foods and decrease intake of processed foods.   Recommend intake of daily multivitamin, Vitamin D, and calcium.   recommend immunizations that include influenza (declined), TDAP (I am obtaining her records from the Urgent Care in [place] where she received the TDAP.    She continues to not be sexually active, no PAP done.   - CBC no Diff - BMP8+eGFR - Lipid  Profile  2. Vitamin D deficiency  Will check vitamin d level  Continue with supplement daily - Vitamin D 1,25 Dihydroxy      Minette Brine, FNP

## 2019-02-15 NOTE — Patient Instructions (Addendum)
Health Maintenance  Topic Date Due  . TETANUS/TDAP  07/19/2010  . PAP SMEAR-Modifier  02/15/2019 (Originally 07/18/2012)  . INFLUENZA VACCINE  07/28/2019 (Originally 11/28/2018)  . PAP-Cervical Cytology Screening  02/15/2020 (Originally 07/18/2012)  . HIV Screening  02/15/2020 (Originally 07/19/2006)   Health Maintenance, Female Adopting a healthy lifestyle and getting preventive care are important in promoting health and wellness. Ask your health care provider about:  The right schedule for you to have regular tests and exams.  Things you can do on your own to prevent diseases and keep yourself healthy. What should I know about diet, weight, and exercise? Eat a healthy diet   Eat a diet that includes plenty of vegetables, fruits, low-fat dairy products, and lean protein.  Do not eat a lot of foods that are high in solid fats, added sugars, or sodium. Maintain a healthy weight Body mass index (BMI) is used to identify weight problems. It estimates body fat based on height and weight. Your health care provider can help determine your BMI and help you achieve or maintain a healthy weight. Get regular exercise Get regular exercise. This is one of the most important things you can do for your health. Most adults should:  Exercise for at least 150 minutes each week. The exercise should increase your heart rate and make you sweat (moderate-intensity exercise).  Do strengthening exercises at least twice a week. This is in addition to the moderate-intensity exercise.  Spend less time sitting. Even light physical activity can be beneficial. Watch cholesterol and blood lipids Have your blood tested for lipids and cholesterol at 27 years of age, then have this test every 5 years. Have your cholesterol levels checked more often if:  Your lipid or cholesterol levels are high.  You are older than 27 years of age.  You are at high risk for heart disease. What should I know about cancer screening?  Depending on your health history and family history, you may need to have cancer screening at various ages. This may include screening for:  Breast cancer.  Cervical cancer.  Colorectal cancer.  Skin cancer.  Lung cancer. What should I know about heart disease, diabetes, and high blood pressure? Blood pressure and heart disease  High blood pressure causes heart disease and increases the risk of stroke. This is more likely to develop in people who have high blood pressure readings, are of African descent, or are overweight.  Have your blood pressure checked: ? Every 3-5 years if you are 40-13 years of age. ? Every year if you are 32 years old or older. Diabetes Have regular diabetes screenings. This checks your fasting blood sugar level. Have the screening done:  Once every three years after age 33 if you are at a normal weight and have a low risk for diabetes.  More often and at a younger age if you are overweight or have a high risk for diabetes. What should I know about preventing infection? Hepatitis B If you have a higher risk for hepatitis B, you should be screened for this virus. Talk with your health care provider to find out if you are at risk for hepatitis B infection. Hepatitis C Testing is recommended for:  Everyone born from [birth year] through 1964.  Anyone with known risk factors for hepatitis C. Sexually transmitted infections (STIs)  Get screened for STIs, including gonorrhea and chlamydia, if: ? You are sexually active and are younger than 27 years of age. ? You are older than 27 years  of age and your health care provider tells you that you are at risk for this type of infection. ? Your sexual activity has changed since you were last screened, and you are at increased risk for chlamydia or gonorrhea. Ask your health care provider if you are at risk.  Ask your health care provider about whether you are at high risk for HIV. Your health care provider may recommend a  prescription medicine to help prevent HIV infection. If you choose to take medicine to prevent HIV, you should first get tested for HIV. You should then be tested every 3 months for as long as you are taking the medicine. Pregnancy  If you are about to stop having your period (premenopausal) and you may become pregnant, seek counseling before you get pregnant.  Take 400 to 800 micrograms (mcg) of folic acid every day if you become pregnant.  Ask for birth control (contraception) if you want to prevent pregnancy. Osteoporosis and menopause Osteoporosis is a disease in which the bones lose minerals and strength with aging. This can result in bone fractures. If you are 22 years old or older, or if you are at risk for osteoporosis and fractures, ask your health care provider if you should:  Be screened for bone loss.  Take a calcium or vitamin D supplement to lower your risk of fractures.  Be given hormone replacement therapy (HRT) to treat symptoms of menopause. Follow these instructions at home: Lifestyle  Do not use any products that contain nicotine or tobacco, such as cigarettes, e-cigarettes, and chewing tobacco. If you need help quitting, ask your health care provider.  Do not use street drugs.  Do not share needles.  Ask your health care provider for help if you need support or information about quitting drugs. Alcohol use  Do not drink alcohol if: ? Your health care provider tells you not to drink. ? You are pregnant, may be pregnant, or are planning to become pregnant.  If you drink alcohol: ? Limit how much you use to 0-1 drink a day. ? Limit intake if you are breastfeeding.  Be aware of how much alcohol is in your drink. In the [place] one drink equals one 12 oz bottle of beer (355 mL), one 5 oz glass of wine (148 mL), or one 1 oz glass of hard liquor (44 mL). General instructions  Schedule regular health, dental, and eye exams.  Stay current with your vaccines.   Tell your health care provider if: ? You often feel depressed. ? You have ever been abused or do not feel safe at home. Summary  Adopting a healthy lifestyle and getting preventive care are important in promoting health and wellness.  Follow your health care provider's instructions about healthy diet, exercising, and getting tested or screened for diseases.  Follow your health care provider's instructions on monitoring your cholesterol and blood pressure. This information is not intended to replace advice given to you by your health care provider. Make sure you discuss any questions you have with your health care provider. Document Released: 10/29/2010 Document Revised: 04/08/2018 Document Reviewed: 04/08/2018 Elsevier Patient Education  2020 [employer].

## 2019-02-16 LAB — BMP8+EGFR
BUN/Creatinine Ratio: 13 (ref 9–23)
BUN: 10 mg/dL (ref 6–20)
CO2: 24 mmol/L (ref 20–29)
Calcium: 9.1 mg/dL (ref 8.7–10.2)
Chloride: 102 mmol/L (ref 96–106)
Creatinine, Ser: 0.76 mg/dL (ref 0.57–1.00)
GFR calc Af Amer: 124 mL/min/{1.73_m2} (ref >59)
GFR calc non Af Amer: 108 mL/min/{1.73_m2} (ref >59)
Glucose: 90 mg/dL (ref 65–99)
Potassium: 4 mmol/L (ref 3.5–5.2)
Sodium: 138 mmol/L (ref 134–144)

## 2019-02-16 LAB — CBC
Hematocrit: 36.9 % (ref 34.0–46.6)
Hemoglobin: 12.1 g/dL (ref 11.1–15.9)
MCH: 27.5 pg (ref 26.6–33.0)
MCHC: 32.8 g/dL (ref 31.5–35.7)
MCV: 84 fL (ref 79–97)
Platelets: 231 10*3/uL (ref 150–450)
RBC: 4.4 x10E6/uL (ref 3.77–5.28)
RDW: 12.7 % (ref 11.7–15.4)
WBC: 2.7 10*3/uL — ABNORMAL LOW (ref 3.4–10.8)

## 2019-02-16 LAB — VITAMIN D 25 HYDROXY (VIT D DEFICIENCY, FRACTURES): Vit D, 25-Hydroxy: 25.8 ng/mL — ABNORMAL LOW (ref 30.0–100.0)

## 2019-02-17 ENCOUNTER — Other Ambulatory Visit: Payer: Self-pay | Admitting: Nurse Practitioner

## 2019-02-17 ENCOUNTER — Encounter: Payer: Self-pay | Admitting: Nurse Practitioner

## 2019-02-17 DIAGNOSIS — E559 Vitamin D deficiency, unspecified: Secondary | ICD-10-CM

## 2019-02-17 MED ORDER — VITAMIN D (ERGOCALCIFEROL) 1.25 MG (50000 UNIT) PO CAPS: 50000.0000 [IU] | ORAL_CAPSULE | ORAL | 0 refills | Status: DC

## 2019-02-25 DIAGNOSIS — L700 Acne vulgaris: Secondary | ICD-10-CM | POA: Diagnosis not present

## 2019-02-25 DIAGNOSIS — D229 Melanocytic nevi, unspecified: Secondary | ICD-10-CM | POA: Diagnosis not present

## 2019-02-25 DIAGNOSIS — L821 Other seborrheic keratosis: Secondary | ICD-10-CM | POA: Diagnosis not present

## 2019-02-25 DIAGNOSIS — L910 Hypertrophic scar: Secondary | ICD-10-CM | POA: Diagnosis not present

## 2019-03-10 LAB — CBC WITH DIFFERENTIAL/PLATELET
Basophils Absolute: 0 10*3/uL (ref 0.0–0.2)
Basos: 1 %
EOS (ABSOLUTE): 0 10*3/uL (ref 0.0–0.4)
Eos: 1 %
Hematocrit: 38.6 % (ref 34.0–46.6)
Hemoglobin: 12.3 g/dL (ref 11.1–15.9)
Immature Grans (Abs): 0 10*3/uL (ref 0.0–0.1)
Immature Granulocytes: 0 %
Lymphocytes Absolute: 1.2 10*3/uL (ref 0.7–3.1)
Lymphs: 44 %
MCH: 27.8 pg (ref 26.6–33.0)
MCHC: 31.9 g/dL (ref 31.5–35.7)
MCV: 87 fL (ref 79–97)
Monocytes Absolute: 0.3 10*3/uL (ref 0.1–0.9)
Monocytes: 12 %
Neutrophils Absolute: 1.2 10*3/uL — ABNORMAL LOW (ref 1.4–7.0)
Neutrophils: 42 %
Platelets: 257 10*3/uL (ref 150–450)
RBC: 4.43 x10E6/uL (ref 3.77–5.28)
RDW: 13.6 % (ref 11.7–15.4)
WBC: 2.9 10*3/uL — ABNORMAL LOW (ref 3.4–10.8)

## 2019-03-10 LAB — SPECIMEN STATUS REPORT

## 2019-05-06 ENCOUNTER — Other Ambulatory Visit: Payer: Self-pay | Admitting: Nurse Practitioner

## 2019-05-06 DIAGNOSIS — E559 Vitamin D deficiency, unspecified: Secondary | ICD-10-CM

## 2019-06-10 DIAGNOSIS — L700 Acne vulgaris: Secondary | ICD-10-CM | POA: Diagnosis not present

## 2019-06-10 DIAGNOSIS — L810 Postinflammatory hyperpigmentation: Secondary | ICD-10-CM | POA: Diagnosis not present

## 2019-08-05 ENCOUNTER — Ambulatory Visit: Payer: Self-pay | Attending: Family

## 2019-08-05 DIAGNOSIS — Z23 Encounter for immunization: Secondary | ICD-10-CM

## 2019-08-05 NOTE — Progress Notes (Signed)
   Covid-19 Vaccination Clinic  Name:  [name]    MRN: [id number] DOB: July 25, 1990  08/05/2019  [name] was observed post Covid-19 immunization for 15 minutes without incident. She was provided with Vaccine Information Sheet and instruction to access the V-Safe system.   [name] was instructed to call 911 with any severe reactions post vaccine: Marland Kitchen Difficulty breathing  . Swelling of face and throat  . A fast heartbeat  . A bad rash all over body  . Dizziness and weakness   Immunizations Administered    Name Date Dose VIS Date Route   Moderna COVID-19 Vaccine 08/05/2019 11:00 AM 0.5 mL 03/30/2019 Intramuscular   Manufacturer: Moderna   Lot: [id number]   [id number]

## 2019-09-07 ENCOUNTER — Ambulatory Visit: Payer: Self-pay | Attending: Family

## 2019-09-07 DIAGNOSIS — Z23 Encounter for immunization: Secondary | ICD-10-CM

## 2019-09-07 NOTE — Progress Notes (Signed)
   Covid-19 Vaccination [facility]  Name:  Kathy Richmond    MRN: [id number] DOB: May 24, 1990  09/07/2019  [name] was observed post Covid-19 immunization for 15 minutes without incident. She was provided with Vaccine Information Sheet and instruction to access the V-Safe system.   [name] was instructed to call 911 with any severe reactions post vaccine: Marland Kitchen Difficulty breathing  . Swelling of face and throat  . A fast heartbeat  . A bad rash all over body  . Dizziness and weakness   Immunizations Administered    Name Date Dose VIS Date Route   Moderna COVID-19 Vaccine 09/07/2019 10:17 AM 0.5 mL 03/2019 Intramuscular   Manufacturer: Moderna   Lot: [id number]   [id number]

## 2019-10-07 DIAGNOSIS — L810 Postinflammatory hyperpigmentation: Secondary | ICD-10-CM | POA: Diagnosis not present

## 2019-10-07 DIAGNOSIS — L700 Acne vulgaris: Secondary | ICD-10-CM | POA: Diagnosis not present

## 2020-02-17 ENCOUNTER — Other Ambulatory Visit: Payer: Self-pay

## 2020-02-17 ENCOUNTER — Ambulatory Visit (INDEPENDENT_AMBULATORY_CARE_PROVIDER_SITE_OTHER): Payer: BC Managed Care – PPO | Admitting: Nurse Practitioner

## 2020-02-17 ENCOUNTER — Encounter: Payer: Self-pay | Admitting: Nurse Practitioner

## 2020-02-17 VITALS — BP 116/78 | HR 74 | Temp 98.2°F | Ht 63.0 in | Wt 126.4 lb

## 2020-02-17 DIAGNOSIS — Z0000 Encounter for general adult medical examination without abnormal findings: Secondary | ICD-10-CM | POA: Diagnosis not present

## 2020-02-17 DIAGNOSIS — Z1159 Encounter for screening for other viral diseases: Secondary | ICD-10-CM

## 2020-02-17 DIAGNOSIS — E559 Vitamin D deficiency, unspecified: Secondary | ICD-10-CM

## 2020-02-17 NOTE — Patient Instructions (Signed)
Health Maintenance, Female Adopting a healthy lifestyle and getting preventive care are important in promoting health and wellness. Ask your health care provider about:  The right schedule for you to have regular tests and exams.  Things you can do on your own to prevent diseases and keep yourself healthy. What should I know about diet, weight, and exercise? Eat a healthy diet   Eat a diet that includes plenty of vegetables, fruits, low-fat dairy products, and lean protein.  Do not eat a lot of foods that are high in solid fats, added sugars, or sodium. Maintain a healthy weight Body mass index (BMI) is used to identify weight problems. It estimates body fat based on height and weight. Your health care provider can help determine your BMI and help you achieve or maintain a healthy weight. Get regular exercise Get regular exercise. This is one of the most important things you can do for your health. Most adults should:  Exercise for at least 150 minutes each week. The exercise should increase your heart rate and make you sweat (moderate-intensity exercise).  Do strengthening exercises at least twice a week. This is in addition to the moderate-intensity exercise.  Spend less time sitting. Even light physical activity can be beneficial. Watch cholesterol and blood lipids Have your blood tested for lipids and cholesterol at 28 years of age, then have this test every 5 years. Have your cholesterol levels checked more often if:  Your lipid or cholesterol levels are high.  You are older than 28 years of age.  You are at high risk for heart disease. What should I know about cancer screening? Depending on your health history and family history, you may need to have cancer screening at various ages. This may include screening for:  Breast cancer.  Cervical cancer.  Colorectal cancer.  Skin cancer.  Lung cancer. What should I know about heart disease, diabetes, and high blood  pressure? Blood pressure and heart disease  High blood pressure causes heart disease and increases the risk of stroke. This is more likely to develop in people who have high blood pressure readings, are of African descent, or are overweight.  Have your blood pressure checked: ? Every 3-5 years if you are 18-39 years of age. ? Every year if you are 40 years old or older. Diabetes Have regular diabetes screenings. This checks your fasting blood sugar level. Have the screening done:  Once every three years after age 40 if you are at a normal weight and have a low risk for diabetes.  More often and at a younger age if you are overweight or have a high risk for diabetes. What should I know about preventing infection? Hepatitis B If you have a higher risk for hepatitis B, you should be screened for this virus. Talk with your health care provider to find out if you are at risk for hepatitis B infection. Hepatitis C Testing is recommended for:  Everyone born from 1945 through 1965.  Anyone with known risk factors for hepatitis C. Sexually transmitted infections (STIs)  Get screened for STIs, including gonorrhea and chlamydia, if: ? You are sexually active and are younger than 28 years of age. ? You are older than 28 years of age and your health care provider tells you that you are at risk for this type of infection. ? Your sexual activity has changed since you were last screened, and you are at increased risk for chlamydia or gonorrhea. Ask your health care provider if   you are at risk.  Ask your health care provider about whether you are at high risk for HIV. Your health care provider may recommend a prescription medicine to help prevent HIV infection. If you choose to take medicine to prevent HIV, you should first get tested for HIV. You should then be tested every 3 months for as long as you are taking the medicine. Pregnancy  If you are about to stop having your period (premenopausal) and  you may become pregnant, seek counseling before you get pregnant.  Take 400 to 800 micrograms (mcg) of folic acid every day if you become pregnant.  Ask for birth control (contraception) if you want to prevent pregnancy. Osteoporosis and menopause Osteoporosis is a disease in which the bones lose minerals and strength with aging. This can result in bone fractures. If you are 65 years old or older, or if you are at risk for osteoporosis and fractures, ask your health care provider if you should:  Be screened for bone loss.  Take a calcium or vitamin D supplement to lower your risk of fractures.  Be given hormone replacement therapy (HRT) to treat symptoms of menopause. Follow these instructions at home: Lifestyle  Do not use any products that contain nicotine or tobacco, such as cigarettes, e-cigarettes, and chewing tobacco. If you need help quitting, ask your health care provider.  Do not use street drugs.  Do not share needles.  Ask your health care provider for help if you need support or information about quitting drugs. Alcohol use  Do not drink alcohol if: ? Your health care provider tells you not to drink. ? You are pregnant, may be pregnant, or are planning to become pregnant.  If you drink alcohol: ? Limit how much you use to 0-1 drink a day. ? Limit intake if you are breastfeeding.  Be aware of how much alcohol is in your drink. In the [place] one drink equals one 12 oz bottle of beer (355 mL), one 5 oz glass of wine (148 mL), or one 1 oz glass of hard liquor (44 mL). General instructions  Schedule regular health, dental, and eye exams.  Stay current with your vaccines.  Tell your health care provider if: ? You often feel depressed. ? You have ever been abused or do not feel safe at home. Summary  Adopting a healthy lifestyle and getting preventive care are important in promoting health and wellness.  Follow your health care provider's instructions about healthy  diet, exercising, and getting tested or screened for diseases.  Follow your health care provider's instructions on monitoring your cholesterol and blood pressure. This information is not intended to replace advice given to you by your health care provider. Make sure you discuss any questions you have with your health care provider. Document Revised: 04/08/2018 Document Reviewed: 04/08/2018 Elsevier Patient Education  2020 [employer] Inc[employer]  

## 2020-02-17 NOTE — Progress Notes (Signed)
Rutherford Nail as a scribe for Minette Brine, FNP.,have documented all relevant documentation on the behalf of Minette Brine, FNP,as directed by  Minette Brine, FNP while in the presence of Minette Brine, [place]. This visit occurred during the SARS-CoV-2 public health emergency.  Safety protocols were in place, including screening questions prior to the visit, additional usage of staff PPE, and extensive cleaning of exam room while observing appropriate contact time as indicated for disinfecting solutions.  Subjective:     Patient ID: Kathy Richmond , female    DOB: [birth year] , 29 y.o.   MRN: [id number]   Chief Complaint  Patient presents with  . Annual Exam    HPI  Pt here today for HM     Past Medical History:  Diagnosis Date  . Palpitations      Family History  Problem Relation Age of Onset  . Hypertension Father      Current Outpatient Medications:  Marland Kitchen  Vitamin D, Ergocalciferol, (DRISDOL) 1.25 MG (50000 UT) CAPS capsule, Take 1 capsule (50,000 Units total) by mouth every 7 (seven) days., Disp: 12 capsule, Rfl: 0   No Known Allergies    The patient states she uses none for birth control. Last LMP was Patient's last menstrual period was 02/04/2020.. Negative for Dysmenorrhea and Negative for Menorrhagia. Negative for: breast discharge, breast lump(s), breast pain and breast self exam. Associated symptoms include abnormal vaginal bleeding. Pertinent negatives include abnormal bleeding (hematology), anxiety, decreased libido, depression, difficulty falling sleep, dyspareunia, history of infertility, nocturia, sexual dysfunction, sleep disturbances, urinary incontinence, urinary urgency, vaginal discharge and vaginal itching. Diet regular, no longer vegan.  The patient states her exercise level is 4-5 days a week with weight lifting and cardio.    The patient's tobacco use is:  Social History   Tobacco Use  Smoking Status Never Smoker  Smokeless Tobacco Never Used    She has been exposed to passive smoke. The patient's alcohol use is:  Social History   Substance and Sexual Activity  Alcohol Use No   Comment: occ   Additional information: Last pap - she is not sexually active at all. Never had a PAP    Review of Systems  Constitutional: Negative.  Negative for fatigue.  HENT: Negative.   Eyes: Negative.   Respiratory: Negative.   Cardiovascular: Negative.   Endocrine: Negative for polydipsia, polyphagia and polyuria.  Musculoskeletal: Negative.   Skin: Negative.   Neurological: Negative for dizziness and headaches.  Psychiatric/Behavioral: Negative.      Today's Vitals   02/17/20 1016  BP: 116/78  Pulse: 74  Temp: 98.2 F (36.8 C)  TempSrc: Oral  SpO2: 99%  Weight: 126 lb 6.4 oz (57.3 kg)  Height: 5' 3"  (1.6 m)  PainSc: 0-No pain   Body mass index is 22.39 kg/m.   Objective:  Physical Exam Constitutional:      General: She is not in acute distress.    Appearance: Normal appearance. She is well-developed. She is obese.  HENT:     Head: Normocephalic and atraumatic.     Right Ear: Hearing, tympanic membrane, ear canal and external ear normal. There is no impacted cerumen.     Left Ear: Hearing, tympanic membrane, ear canal and external ear normal. There is no impacted cerumen.     Nose:     Comments: Deferred - masked    Mouth/Throat:     Comments: Deferred - masked Eyes:     General: Lids are normal.  Extraocular Movements: Extraocular movements intact.     Conjunctiva/sclera: Conjunctivae normal.     Pupils: Pupils are equal, round, and reactive to light.     Funduscopic exam:    Right eye: No papilledema.        Left eye: No papilledema.  Neck:     Thyroid: No thyroid mass.     Vascular: No carotid bruit.  Cardiovascular:     Rate and Rhythm: Normal rate and regular rhythm.     Pulses: Normal pulses.     Heart sounds: Normal heart sounds. No murmur heard.   Pulmonary:     Effort: Pulmonary effort is  normal.     Breath sounds: Normal breath sounds.  Chest:     Chest wall: No mass.     Breasts: Tanner Score is 5.        Right: Normal. No mass or tenderness.        Left: Normal. No mass or tenderness.  Abdominal:     General: Abdomen is flat. Bowel sounds are normal. There is no distension.     Palpations: Abdomen is soft.     Tenderness: There is no abdominal tenderness.  Musculoskeletal:        General: No swelling. Normal range of motion.     Cervical back: Full passive range of motion without pain, normal range of motion and neck supple.     Right lower leg: No edema.     Left lower leg: No edema.  Lymphadenopathy:     Upper Body:     Right upper body: No supraclavicular, axillary or pectoral adenopathy.     Left upper body: No supraclavicular, axillary or pectoral adenopathy.  Skin:    General: Skin is warm and dry.     Capillary Refill: Capillary refill takes less than 2 seconds.  Neurological:     General: No focal deficit present.     Mental Status: She is alert and oriented to person, place, and time.     Cranial Nerves: No cranial nerve deficit.     Sensory: No sensory deficit.  Psychiatric:        Mood and Affect: Mood normal.        Behavior: Behavior normal.        Thought Content: Thought content normal.        Judgment: Judgment normal.         Assessment And Plan:     1. Encounter for health maintenance examination . Behavior modifications discussed and diet history reviewed.   . Pt will continue to exercise regularly and modify diet with low GI, plant based foods and decrease intake of processed foods.  . Recommend intake of daily multivitamin, Vitamin D, and calcium.  . Recommend for preventive screenings, as well as recommend immunizations that include influenza, TDAP (updated) . Continue with regular exercise and healthy diet - CMP14+EGFR - CBC - Lipid panel  2. Encounter for hepatitis C screening test for low risk patient  Will check Hepatitis  C screening due to recent recommendations to screen all adults 18 years and older - Hepatitis C antibody  3. Vitamin D deficiency  She is now taking over the counter vitamin d 5,000 units daily  Will check vitamin D level and supplement as needed.     Also encouraged to spend 15 minutes in the sun daily.  - VITAMIN D 25 Hydroxy (Vit-D Deficiency, Fractures)    Patient was given opportunity to ask questions. Patient verbalized understanding  of the plan and was able to repeat key elements of the plan. All questions were answered to their satisfaction.    Teola Bradley, FNP, have reviewed all documentation for this visit. The documentation on 02/17/20 for the exam, diagnosis, procedures, and orders are all accurate and complete.   THE PATIENT IS ENCOURAGED TO PRACTICE SOCIAL DISTANCING DUE TO THE COVID-19 PANDEMIC.

## 2020-02-18 LAB — CMP14+EGFR
ALT: 4 IU/L (ref 0–32)
AST: 11 IU/L (ref 0–40)
Albumin/Globulin Ratio: 1.8 (ref 1.2–2.2)
Albumin: 4.6 g/dL (ref 3.9–5.0)
Alkaline Phosphatase: 44 IU/L (ref 44–121)
BUN/Creatinine Ratio: 8 — ABNORMAL LOW (ref 9–23)
BUN: 6 mg/dL (ref 6–20)
Bilirubin Total: 0.3 mg/dL (ref 0.0–1.2)
CO2: 26 mmol/L (ref 20–29)
Calcium: 9.2 mg/dL (ref 8.7–10.2)
Chloride: 101 mmol/L (ref 96–106)
Creatinine, Ser: 0.72 mg/dL (ref 0.57–1.00)
GFR calc Af Amer: 132 mL/min/{1.73_m2} (ref >59)
GFR calc non Af Amer: 114 mL/min/{1.73_m2} (ref >59)
Globulin, Total: 2.6 g/dL (ref 1.5–4.5)
Glucose: 88 mg/dL (ref 65–99)
Potassium: 3.5 mmol/L (ref 3.5–5.2)
Sodium: 139 mmol/L (ref 134–144)
Total Protein: 7.2 g/dL (ref 6.0–8.5)

## 2020-02-18 LAB — VITAMIN D 25 HYDROXY (VIT D DEFICIENCY, FRACTURES): Vit D, 25-Hydroxy: 34.5 ng/mL (ref 30.0–100.0)

## 2020-02-18 LAB — CBC
Hematocrit: 35.6 % (ref 34.0–46.6)
Hemoglobin: 11.7 g/dL (ref 11.1–15.9)
MCH: 27.9 pg (ref 26.6–33.0)
MCHC: 32.9 g/dL (ref 31.5–35.7)
MCV: 85 fL (ref 79–97)
Platelets: 231 10*3/uL (ref 150–450)
RBC: 4.19 x10E6/uL (ref 3.77–5.28)
RDW: 13.4 % (ref 11.7–15.4)
WBC: 3.8 10*3/uL (ref 3.4–10.8)

## 2020-02-18 LAB — LIPID PANEL
Chol/HDL Ratio: 2.4 ratio (ref 0.0–4.4)
Cholesterol, Total: 132 mg/dL (ref 100–199)
HDL: 56 mg/dL (ref >39)
LDL Chol Calc (NIH): 64 mg/dL (ref 0–99)
Triglycerides: 58 mg/dL (ref 0–149)
VLDL Cholesterol Cal: 12 mg/dL (ref 5–40)

## 2020-02-18 LAB — HEPATITIS C ANTIBODY: Hep C Virus Ab: <0.1 s/co ratio (ref 0.0–0.9)

## 2020-04-13 ENCOUNTER — Other Ambulatory Visit: Payer: Self-pay | Admitting: Nurse Practitioner

## 2020-04-13 DIAGNOSIS — L810 Postinflammatory hyperpigmentation: Secondary | ICD-10-CM | POA: Diagnosis not present

## 2020-04-13 DIAGNOSIS — L709 Acne, unspecified: Secondary | ICD-10-CM

## 2020-04-13 DIAGNOSIS — L700 Acne vulgaris: Secondary | ICD-10-CM | POA: Diagnosis not present

## 2020-04-13 MED ORDER — SPIRONOLACTONE 50 MG PO TABS
50.0000 mg | ORAL_TABLET | Freq: Every day | ORAL | 0 refills | Status: DC
Start: 2020-04-13 — End: 2020-06-13

## 2020-06-13 ENCOUNTER — Ambulatory Visit (INDEPENDENT_AMBULATORY_CARE_PROVIDER_SITE_OTHER): Payer: BC Managed Care – PPO | Admitting: Nurse Practitioner

## 2020-06-13 ENCOUNTER — Encounter: Payer: Self-pay | Admitting: Nurse Practitioner

## 2020-06-13 ENCOUNTER — Other Ambulatory Visit: Payer: Self-pay

## 2020-06-13 VITALS — BP 114/70 | HR 97 | Temp 98.2°F | Ht 63.0 in | Wt 122.8 lb

## 2020-06-13 DIAGNOSIS — L03114 Cellulitis of left upper limb: Secondary | ICD-10-CM | POA: Diagnosis not present

## 2020-06-13 DIAGNOSIS — L299 Pruritus, unspecified: Secondary | ICD-10-CM | POA: Diagnosis not present

## 2020-06-13 DIAGNOSIS — L309 Dermatitis, unspecified: Secondary | ICD-10-CM

## 2020-06-13 MED ORDER — CEPHALEXIN 500 MG PO CAPS: 500.0000 mg | ORAL_CAPSULE | Freq: Four times a day (QID) | ORAL | 0 refills | Status: AC

## 2020-06-13 NOTE — Patient Instructions (Signed)

## 2020-06-13 NOTE — Progress Notes (Signed)
This visit occurred during the SARS-CoV-2 public health emergency.  Safety protocols were in place, including screening questions prior to the visit, additional usage of staff PPE, and extensive cleaning of exam room while observing appropriate contact time as indicated for disinfecting solutions.  Subjective:     Patient ID: Kathy Richmond , female    DOB: 25-Jan-1991 , 30 y.o.   MRN: [id number]   Chief Complaint  Patient presents with  . Rash    Patient stated her ezcema started to flare up on 06/08/20. The rash is on the folds of her elbows, back of her knees and shoulders.    HPI  Patient presents today for a eczema flare up. She noticed in the creases of her elbow had some dry patches. Then on 2/10 she had redness to creases of elbow, shoulders and knees. She is requesting a food allergy panel.  When she eats dairy will have dermatitis on her ear and nose area. Never seen an allergist.    Rash This is a recurrent problem. The current episode started more than 1 month ago. The problem has been gradually worsening since onset. The affected locations include the right elbow, left elbow and left shoulder. The rash is characterized by dryness, itchiness, redness and peeling. She was exposed to nothing. Pertinent negatives include no anorexia. Past treatments include topical steroids. The treatment provided no relief. Her past medical history is significant for eczema.     Past Medical History:  Diagnosis Date  . Palpitations      Family History  Problem Relation Age of Onset  . Hypertension Father      Current Outpatient Medications:  Marland Kitchen  VITAMIN D PO, Take 5,000 Units by mouth daily at 6 (six) AM., Disp: , Rfl:    No Known Allergies   Review of Systems  Constitutional: Negative.   HENT: Negative.   Eyes: Negative.   Respiratory: Negative.   Cardiovascular: Negative.   Gastrointestinal: Negative.  Negative for anorexia.  Endocrine: Negative.   Genitourinary: Negative.    Musculoskeletal: Negative.   Skin: Positive for rash (bilateral antecubital areas with the left arm excoriated and erythematous. ).  Neurological: Negative.   Hematological: Negative.   Psychiatric/Behavioral: Negative.      Today's Vitals   06/13/20 1423  BP: 114/70  Pulse: 97  Temp: 98.2 F (36.8 C)  TempSrc: Oral  Weight: 122 lb 12.8 oz (55.7 kg)  Height: 5\' 3"  (1.6 m)  PainSc: 0-No pain   Body mass index is 21.75 kg/m.   Objective:  Physical Exam Constitutional:      General: She is not in acute distress.    Appearance: Normal appearance. She is normal weight.  Cardiovascular:     Rate and Rhythm: Normal rate and regular rhythm.     Pulses: Normal pulses.     Heart sounds: Normal heart sounds.  Pulmonary:     Effort: Pulmonary effort is normal. No respiratory distress.     Breath sounds: Normal breath sounds. No wheezing.  Skin:    General: Skin is warm and dry.     Capillary Refill: Capillary refill takes less than 2 seconds.     Comments: Removed dead skin from inner left elbow. Pink healing skin with slight erythema present  Neurological:     General: No focal deficit present.     Mental Status: She is alert and oriented to person, place, and time.     Cranial Nerves: No cranial nerve deficit.  Psychiatric:  Mood and Affect: Mood normal.        Behavior: Behavior normal.        Thought Content: Thought content normal.        Judgment: Judgment normal.         Assessment And Plan:     1. Eczema, unspecified type  Will check allergy panel for foods to see if this is the flare for her eczema  Removed dead skin after cleansing with NS - Allergens(46)Foods  2. Pruritus  She is to continue with antiitch cream  Will check for food allergies - Allergens(46)Foods  3. Cellulitis of left upper extremity  Erythema to bilateral inner elbows at the antecubital area  Will treat for cellulitis if not better return call to office - cephALEXin  (KEFLEX) 500 MG capsule; Take 1 capsule (500 mg total) by mouth 4 (four) times daily for 10 days.  Dispense: 40 capsule; Refill: 0     Patient was given opportunity to ask questions. Patient verbalized understanding of the plan and was able to repeat key elements of the plan. All questions were answered to their satisfaction.  Minette Brine, FNP   I, Minette Brine, FNP, have reviewed all documentation for this visit. The documentation on 06/13/20 for the exam, diagnosis, procedures, and orders are all accurate and complete.   THE PATIENT IS ENCOURAGED TO PRACTICE SOCIAL DISTANCING DUE TO THE COVID-19 PANDEMIC.

## 2020-06-17 LAB — ALLERGENS (95) FOODS IGE
Allergen Apple, IgE: <0.1 kU/L
Allergen Banana IgE: <0.1 kU/L
Allergen Barley IgE: <0.1 kU/L
Allergen Black Pepper IgE: <0.1 kU/L
Allergen Blueberry IgE: <0.1 kU/L
Allergen Broccoli: <0.1 kU/L
Allergen Cabbage IgE: <0.1 kU/L
Allergen Carrot IgE: <0.1 kU/L
Allergen Cauliflower IgE: <0.1 kU/L
Allergen Celery IgE: <0.1 kU/L
Allergen Cinnamon IgE: <0.1 kU/L
Allergen Coconut IgE: <0.1 kU/L
Allergen Corn, IgE: <0.1 kU/L
Allergen Cucumber IgE: <0.1 kU/L
Allergen Garlic IgE: <0.1 kU/L
Allergen Ginger IgE: <0.1 kU/L
Allergen Gluten IgE: <0.1 kU/L
Allergen Grape IgE: <0.1 kU/L
Allergen Grapefruit IgE: <0.1 kU/L
Allergen Green Bean IgE: <0.1 kU/L
Allergen Green Pea IgE: <0.1 kU/L
Allergen Lamb IgE: <0.1 kU/L
Allergen Lettuce IgE: <0.1 kU/L
Allergen Lime IgE: <0.1 kU/L
Allergen Melon IgE: <0.1 kU/L
Allergen Oat IgE: <0.1 kU/L
Allergen Onion IgE: <0.1 kU/L
Allergen Pear IgE: <0.1 kU/L
Allergen Potato, White IgE: <0.1 kU/L
Allergen Rice IgE: 0.1 kU/L — AB
Allergen Salmon IgE: <0.1 kU/L
Allergen Strawberry IgE: <0.1 kU/L
Allergen Sweet Potato IgE: <0.1 kU/L
Allergen Tomato, IgE: <0.1 kU/L
Allergen Turkey IgE: <0.1 kU/L
Allergen Watermelon IgE: <0.1 kU/L
Allergen, Peach f95: <0.1 kU/L
Basil: <0.1 kU/L
Beef IgE: <0.1 kU/L
C074-IgE Gelatin: <0.1 kU/L
Chicken IgE: <0.1 kU/L
Chocolate/Cacao IgE: <0.1 kU/L
Clam IgE: <0.1 kU/L
Codfish IgE: <0.1 kU/L
Coffee: <0.1 kU/L
Cranberry IgE: <0.1 kU/L
Egg White IgE: <0.1 kU/L
F020-IgE Almond: <0.1 kU/L
F023-IgE Crab: <0.1 kU/L
F045-IgE Yeast: <0.1 kU/L
F076-IgE Alpha Lactalbumin: <0.1 kU/L
F077-IgE Beta Lactoglobulin: <0.1 kU/L
F078-IgE Casein: <0.1 kU/L
F080-IgE Lobster: <0.1 kU/L
F081-IgE Cheese, Cheddar Type: <0.1 kU/L
F089-IgE Mustard: <0.1 kU/L
F096-IgE Avocado: <0.1 kU/L
F202-IgE Cashew Nut: <0.1 kU/L
F214-IgE Spinach: <0.1 kU/L
F222-IgE Tea: <0.1 kU/L
F242-IgE Bing Cherry: <0.1 kU/L
F261-IgE Asparagus: <0.1 kU/L
F262-IgE Eggplant: <0.1 kU/L
F265-IgE Cumin: <0.1 kU/L
F278-IgE Bayleaf (Laurel): <0.1 kU/L
F279-IgE Chili Pepper: <0.1 kU/L
F283-IgE Oregano: <0.1 kU/L
F300-IgE Goat's Milk: <0.1 kU/L
F342-IgE Olive, Black: <0.1 kU/L
F343-IgE Raspberry: <0.1 kU/L
Hops: <0.1 kU/L
IgE Egg (Yolk): <0.1 kU/L
Kidney Bean IgE: <0.1 kU/L
Lemon: <0.1 kU/L
Lima Bean IgE: <0.1 kU/L
Malt: <0.1 kU/L
Mushroom IgE: <0.1 kU/L
Orange: <0.1 kU/L
Paprika IgE: <0.1 kU/L
Peanut IgE: <0.1 kU/L
Pineapple IgE: <0.1 kU/L
Pork IgE: <0.1 kU/L
Pumpkin IgE: <0.1 kU/L
Red Beet: <0.1 kU/L
Rye IgE: <0.1 kU/L
Scallop IgE: <0.1 kU/L
Sesame Seed IgE: <0.1 kU/L
Shrimp IgE: <0.1 kU/L
Soybean IgE: <0.1 kU/L
Tuna: <0.1 kU/L
Vanilla: <0.1 kU/L
Walnut IgE: <0.1 kU/L
Wheat IgE: <0.1 kU/L
Whey: <0.1 kU/L
White Bean IgE: <0.1 kU/L

## 2020-07-20 DIAGNOSIS — L2084 Intrinsic (allergic) eczema: Secondary | ICD-10-CM | POA: Diagnosis not present

## 2020-07-20 DIAGNOSIS — L700 Acne vulgaris: Secondary | ICD-10-CM | POA: Diagnosis not present

## 2021-01-25 DIAGNOSIS — L2084 Intrinsic (allergic) eczema: Secondary | ICD-10-CM | POA: Diagnosis not present

## 2021-01-25 DIAGNOSIS — L700 Acne vulgaris: Secondary | ICD-10-CM | POA: Diagnosis not present

## 2021-01-25 DIAGNOSIS — L810 Postinflammatory hyperpigmentation: Secondary | ICD-10-CM | POA: Diagnosis not present

## 2021-02-20 ENCOUNTER — Encounter: Payer: Self-pay | Admitting: Nurse Practitioner

## 2021-02-20 ENCOUNTER — Other Ambulatory Visit: Payer: Self-pay

## 2021-02-20 ENCOUNTER — Ambulatory Visit (INDEPENDENT_AMBULATORY_CARE_PROVIDER_SITE_OTHER): Payer: BC Managed Care – PPO | Admitting: Nurse Practitioner

## 2021-02-20 VITALS — BP 102/80 | HR 78 | Temp 98.1°F | Ht 63.0 in | Wt 122.8 lb

## 2021-02-20 DIAGNOSIS — Z0000 Encounter for general adult medical examination without abnormal findings: Secondary | ICD-10-CM | POA: Diagnosis not present

## 2021-02-20 DIAGNOSIS — N63 Unspecified lump in unspecified breast: Secondary | ICD-10-CM | POA: Diagnosis not present

## 2021-02-20 LAB — LIPID PANEL
Chol/HDL Ratio: 2.3 ratio (ref 0.0–4.4)
Cholesterol, Total: 144 mg/dL (ref 100–199)
HDL: 63 mg/dL (ref >39)
LDL Chol Calc (NIH): 70 mg/dL (ref 0–99)
Triglycerides: 48 mg/dL (ref 0–149)
VLDL Cholesterol Cal: 11 mg/dL (ref 5–40)

## 2021-02-20 LAB — CMP14+EGFR
ALT: 7 IU/L (ref 0–32)
AST: 13 IU/L (ref 0–40)
Albumin/Globulin Ratio: 1.8 (ref 1.2–2.2)
Albumin: 4.8 g/dL (ref 3.9–5.0)
Alkaline Phosphatase: 44 IU/L (ref 44–121)
BUN/Creatinine Ratio: 11 (ref 9–23)
BUN: 9 mg/dL (ref 6–20)
Bilirubin Total: 0.3 mg/dL (ref 0.0–1.2)
CO2: 26 mmol/L (ref 20–29)
Calcium: 9.3 mg/dL (ref 8.7–10.2)
Chloride: 101 mmol/L (ref 96–106)
Creatinine, Ser: 0.83 mg/dL (ref 0.57–1.00)
Globulin, Total: 2.7 g/dL (ref 1.5–4.5)
Glucose: 97 mg/dL (ref 70–99)
Potassium: 4.8 mmol/L (ref 3.5–5.2)
Sodium: 139 mmol/L (ref 134–144)
Total Protein: 7.5 g/dL (ref 6.0–8.5)
eGFR: 98 mL/min/{1.73_m2} (ref >59)

## 2021-02-20 LAB — CBC
Hematocrit: 36.4 % (ref 34.0–46.6)
Hemoglobin: 12.1 g/dL (ref 11.1–15.9)
MCH: 27.8 pg (ref 26.6–33.0)
MCHC: 33.2 g/dL (ref 31.5–35.7)
MCV: 84 fL (ref 79–97)
Platelets: 244 10*3/uL (ref 150–450)
RBC: 4.35 x10E6/uL (ref 3.77–5.28)
RDW: 13.4 % (ref 11.7–15.4)
WBC: 3 10*3/uL — ABNORMAL LOW (ref 3.4–10.8)

## 2021-02-20 NOTE — Patient Instructions (Signed)
Health Maintenance, Female Adopting a healthy lifestyle and getting preventive care are important in promoting health and wellness. Ask your health care provider about: The right schedule for you to have regular tests and exams. Things you can do on your own to prevent diseases and keep yourself healthy. What should I know about diet, weight, and exercise? Eat a healthy diet  Eat a diet that includes plenty of vegetables, fruits, low-fat dairy products, and lean protein. Do not eat a lot of foods that are high in solid fats, added sugars, or sodium. Maintain a healthy weight Body mass index (BMI) is used to identify weight problems. It estimates body fat based on height and weight. Your health care provider can help determine your BMI and help you achieve or maintain a healthy weight. Get regular exercise Get regular exercise. This is one of the most important things you can do for your health. Most adults should: Exercise for at least 150 minutes each week. The exercise should increase your heart rate and make you sweat (moderate-intensity exercise). Do strengthening exercises at least twice a week. This is in addition to the moderate-intensity exercise. Spend less time sitting. Even light physical activity can be beneficial. Watch cholesterol and blood lipids Have your blood tested for lipids and cholesterol at 29 years of age, then have this test every 5 years. Have your cholesterol levels checked more often if: Your lipid or cholesterol levels are high. You are older than 29 years of age. You are at high risk for heart disease. What should I know about cancer screening? Depending on your health history and family history, you may need to have cancer screening at various ages. This may include screening for: Breast cancer. Cervical cancer. Colorectal cancer. Skin cancer. Lung cancer. What should I know about heart disease, diabetes, and high blood pressure? Blood pressure and heart  disease High blood pressure causes heart disease and increases the risk of stroke. This is more likely to develop in people who have high blood pressure readings, are of African descent, or are overweight. Have your blood pressure checked: Every 3-5 years if you are 18-39 years of age. Every year if you are 40 years old or older. Diabetes Have regular diabetes screenings. This checks your fasting blood sugar level. Have the screening done: Once every three years after age 40 if you are at a normal weight and have a low risk for diabetes. More often and at a younger age if you are overweight or have a high risk for diabetes. What should I know about preventing infection? Hepatitis B If you have a higher risk for hepatitis B, you should be screened for this virus. Talk with your health care provider to find out if you are at risk for hepatitis B infection. Hepatitis C Testing is recommended for: Everyone born from 1945 through 1965. Anyone with known risk factors for hepatitis C. Sexually transmitted infections (STIs) Get screened for STIs, including gonorrhea and chlamydia, if: You are sexually active and are younger than 29 years of age. You are older than 29 years of age and your health care provider tells you that you are at risk for this type of infection. Your sexual activity has changed since you were last screened, and you are at increased risk for chlamydia or gonorrhea. Ask your health care provider if you are at risk. Ask your health care provider about whether you are at high risk for HIV. Your health care provider may recommend a prescription medicine   to help prevent HIV infection. If you choose to take medicine to prevent HIV, you should first get tested for HIV. You should then be tested every 3 months for as long as you are taking the medicine. Pregnancy If you are about to stop having your period (premenopausal) and you may become pregnant, seek counseling before you get  pregnant. Take 400 to 800 micrograms (mcg) of folic acid every day if you become pregnant. Ask for birth control (contraception) if you want to prevent pregnancy. Osteoporosis and menopause Osteoporosis is a disease in which the bones lose minerals and strength with aging. This can result in bone fractures. If you are 65 years old or older, or if you are at risk for osteoporosis and fractures, ask your health care provider if you should: Be screened for bone loss. Take a calcium or vitamin D supplement to lower your risk of fractures. Be given hormone replacement therapy (HRT) to treat symptoms of menopause. Follow these instructions at home: Lifestyle Do not use any products that contain nicotine or tobacco, such as cigarettes, e-cigarettes, and chewing tobacco. If you need help quitting, ask your health care provider. Do not use street drugs. Do not share needles. Ask your health care provider for help if you need support or information about quitting drugs. Alcohol use Do not drink alcohol if: Your health care provider tells you not to drink. You are pregnant, may be pregnant, or are planning to become pregnant. If you drink alcohol: Limit how much you use to 0-1 drink a day. Limit intake if you are breastfeeding. Be aware of how much alcohol is in your drink. In the [place] one drink equals one 12 oz bottle of beer (355 mL), one 5 oz glass of wine (148 mL), or one 1 oz glass of hard liquor (44 mL). General instructions Schedule regular health, dental, and eye exams. Stay current with your vaccines. Tell your health care provider if: You often feel depressed. You have ever been abused or do not feel safe at home. Summary Adopting a healthy lifestyle and getting preventive care are important in promoting health and wellness. Follow your health care provider's instructions about healthy diet, exercising, and getting tested or screened for diseases. Follow your health care provider's  instructions on monitoring your cholesterol and blood pressure. This information is not intended to replace advice given to you by your health care provider. Make sure you discuss any questions you have with your health care provider. Document Revised: 06/23/2020 Document Reviewed: 04/08/2018 Elsevier Patient Education  2022 [employer]  

## 2021-02-20 NOTE — Progress Notes (Signed)
I,Amador Braddy,acting as a Education administrator for Minette Brine, FNP.,have documented all relevant documentation on the behalf of Minette Brine, FNP,as directed by  Minette Brine, FNP while in the presence of Minette Brine, [place].   This visit occurred during the SARS-CoV-2 public health emergency.  Safety protocols were in place, including screening questions prior to the visit, additional usage of staff PPE, and extensive cleaning of exam room while observing appropriate contact time as indicated for disinfecting solutions.  Subjective:     Patient ID: Kathy Richmond , female    DOB: 10/25/1990 , 30 y.o.   MRN: [id number]   Chief Complaint  Patient presents with   Annual Exam    HPI  Pt here for HM. She has been to Dermatology and treated for Eczema. She does not have a GYN.  She is not sexually active.   Wt Readings from Last 3 Encounters: 02/20/21 : 122 lb 12.8 oz (55.7 kg) 06/13/20 : 122 lb 12.8 oz (55.7 kg) 02/17/20 : 126 lb 6.4 oz (57.3 kg)      Past Medical History:  Diagnosis Date   Palpitations      Family History  Problem Relation Age of Onset   Hypertension Father      Current Outpatient Medications:    VITAMIN D PO, Take 5,000 Units by mouth daily at 6 (six) AM., Disp: , Rfl:    No Known Allergies    The patient states she uses abstinence for birth control. Last LMP was No LMP recorded (lmp unknown).. Negative for Dysmenorrhea and Negative for Menorrhagia. Negative for: breast discharge, breast lump(s), breast pain and breast self exam. Associated symptoms include abnormal vaginal bleeding. Pertinent negatives include abnormal bleeding (hematology), anxiety, decreased libido, depression, difficulty falling sleep, dyspareunia, history of infertility, nocturia, sexual dysfunction, sleep disturbances, urinary incontinence, urinary urgency, vaginal discharge and vaginal itching. Diet regular. The patient states her exercise level is moderate - 5 days a week.   The patient's tobacco  use is:  Social History   Tobacco Use  Smoking Status Never  Smokeless Tobacco Never   She has been exposed to passive smoke. The patient's alcohol use is:  Social History   Substance and Sexual Activity  Alcohol Use No   Comment: occ  Additional information: Last pap never had one never been sexually active.   Review of Systems  Constitutional: Negative.  Negative for fatigue.  HENT: Negative.    Eyes: Negative.   Respiratory: Negative.    Cardiovascular: Negative.   Gastrointestinal: Negative.   Endocrine: Negative for polydipsia, polyphagia and polyuria.  Genitourinary: Negative.        Feels firm area to her left breast - reports has been present for several years.   Musculoskeletal: Negative.   Skin: Negative.   Neurological: Negative.  Negative for dizziness and headaches.  Psychiatric/Behavioral: Negative.      Today's Vitals   02/20/21 1019  BP: 102/80  Pulse: 78  Temp: 98.1 F (36.7 C)  Weight: 122 lb 12.8 oz (55.7 kg)  Height: 5' 3"  (1.6 m)  PainSc: 0-No pain   Body mass index is 21.75 kg/m.  Wt Readings from Last 3 Encounters:  02/20/21 122 lb 12.8 oz (55.7 kg)  06/13/20 122 lb 12.8 oz (55.7 kg)  02/17/20 126 lb 6.4 oz (57.3 kg)   r Objective:  Physical Exam Constitutional:      General: She is not in acute distress.    Appearance: Normal appearance. She is well-developed.  HENT:  Head: Normocephalic and atraumatic.     Right Ear: Hearing, tympanic membrane, ear canal and external ear normal. There is no impacted cerumen.     Left Ear: Hearing, tympanic membrane, ear canal and external ear normal. There is no impacted cerumen.     Nose:     Comments: Deferred - masked    Mouth/Throat:     Comments: Deferred - masked Eyes:     General: Lids are normal.     Extraocular Movements: Extraocular movements intact.     Conjunctiva/sclera: Conjunctivae normal.     Pupils: Pupils are equal, round, and reactive to light.     Funduscopic exam:     Right eye: No papilledema.        Left eye: No papilledema.  Neck:     Thyroid: No thyroid mass.     Vascular: No carotid bruit.  Cardiovascular:     Rate and Rhythm: Normal rate and regular rhythm.     Pulses: Normal pulses.     Heart sounds: Normal heart sounds. No murmur heard. Pulmonary:     Effort: Pulmonary effort is normal. No respiratory distress.     Breath sounds: Normal breath sounds. No wheezing.  Chest:     Chest wall: No mass.  Breasts:    Tanner Score is 5.     Right: Normal. No mass or tenderness.     Left: Normal. No mass (palpable nodule to left brease at 1 o'clock) or tenderness.    Abdominal:     General: Abdomen is flat. Bowel sounds are normal. There is no distension.     Palpations: Abdomen is soft.     Tenderness: There is no abdominal tenderness.  Musculoskeletal:        General: No swelling. Normal range of motion.     Cervical back: Full passive range of motion without pain, normal range of motion and neck supple.     Right lower leg: No edema.     Left lower leg: No edema.  Lymphadenopathy:     Upper Body:     Right upper body: No supraclavicular, axillary or pectoral adenopathy.     Left upper body: No supraclavicular, axillary or pectoral adenopathy.  Skin:    General: Skin is warm and dry.     Capillary Refill: Capillary refill takes less than 2 seconds.  Neurological:     General: No focal deficit present.     Mental Status: She is alert and oriented to person, place, and time.     Cranial Nerves: No cranial nerve deficit.     Sensory: No sensory deficit.  Psychiatric:        Mood and Affect: Mood normal.        Behavior: Behavior normal.        Thought Content: Thought content normal.        Judgment: Judgment normal.        Assessment And Plan:     1. Encounter for health maintenance examination Behavior modifications discussed and diet history reviewed.   Pt will continue to exercise regularly and modify diet with low GI, plant  based foods and decrease intake of processed foods.  Recommend intake of daily multivitamin, Vitamin D, and calcium.  Recommend mammogram and colonoscopy for preventive screenings, as well as recommend immunizations that include influenza (declines), TDAP (up to date) - CMP14+EGFR - CBC - Lipid panel  2. Nodule of skin of breast Comments: Firm nodule palpated to left breast at 1  o'clock, will check an ultrasound due to age.  - [place] CHEST SOFT TISSUE; Future     Patient was given opportunity to ask questions. Patient verbalized understanding of the plan and was able to repeat key elements of the plan. All questions were answered to their satisfaction.   Minette Brine, FNP   I, Minette Brine, FNP, have reviewed all documentation for this visit. The documentation on 02/20/21 for the exam, diagnosis, procedures, and orders are all accurate and complete.  THE PATIENT IS ENCOURAGED TO PRACTICE SOCIAL DISTANCING DUE TO THE COVID-19 PANDEMIC.

## 2021-03-07 ENCOUNTER — Encounter: Payer: Self-pay | Admitting: Nurse Practitioner

## 2021-03-19 DIAGNOSIS — Z01419 Encounter for gynecological examination (general) (routine) without abnormal findings: Secondary | ICD-10-CM | POA: Diagnosis not present

## 2021-03-27 ENCOUNTER — Ambulatory Visit
Admission: RE | Admit: 2021-03-27 | Discharge: 2021-03-27 | Disposition: A | Discharge status: Home / Self Care | Payer: BC Managed Care – PPO | Source: Ambulatory Visit | Attending: Nurse Practitioner | Admitting: Nurse Practitioner

## 2021-03-27 DIAGNOSIS — N63 Unspecified lump in unspecified breast: Secondary | ICD-10-CM

## 2021-03-27 DIAGNOSIS — N6321 Unspecified lump in the left breast, upper outer quadrant: Secondary | ICD-10-CM | POA: Diagnosis not present

## 2021-09-26 ENCOUNTER — Ambulatory Visit: Payer: BC Managed Care – PPO | Admitting: Podiatry

## 2021-09-26 ENCOUNTER — Encounter: Payer: Self-pay | Admitting: Podiatry

## 2021-09-26 DIAGNOSIS — L600 Ingrowing nail: Secondary | ICD-10-CM | POA: Diagnosis not present

## 2021-09-26 NOTE — Progress Notes (Signed)
  Subjective:  Patient ID: Kathy Richmond, female    DOB: [birth year],  MRN: [id number]  Chief Complaint  Patient presents with   Ingrown Toenail    31 y.o. female presents with the above complaint.  Patient presents with complaint of left hallux lateral border ingrown.  Patient states is painful to touch.  Has progressive gotten worse.  She has tried self debridement which has not helped.  Hurts with ambulation pain scale 7 out of 10.  She would like to have removed.  She has not seen anyone else prior to seeing me for this   Review of Systems: Negative except as noted in the HPI. Denies N/V/F/Ch.  Past Medical History:  Diagnosis Date   Palpitations     Current Outpatient Medications:    VITAMIN D PO, Take 5,000 Units by mouth daily at 6 (six) AM., Disp: , Rfl:   Social History   Tobacco Use  Smoking Status Never  Smokeless Tobacco Never    No Known Allergies Objective:  There were no vitals filed for this visit. There is no height or weight on file to calculate BMI. Constitutional Well developed. Well nourished.  Vascular Dorsalis pedis pulses palpable bilaterally. Posterior tibial pulses palpable bilaterally. Capillary refill normal to all digits.  No cyanosis or clubbing noted. Pedal hair growth normal.  Neurologic Normal speech. Oriented to person, place, and time. Epicritic sensation to light touch grossly present bilaterally.  Dermatologic Painful ingrowing nail at lateral nail borders of the hallux nail left. No other open wounds. No skin lesions.  Orthopedic: Normal joint ROM without pain or crepitus bilaterally. No visible deformities. No bony tenderness.   Radiographs: None Assessment:   1. Ingrown left big toenail    Plan:  Patient was evaluated and treated and all questions answered.  Ingrown Nail, left -Patient elects to proceed with minor surgery to remove ingrown toenail removal today. Consent reviewed and signed by patient. -Ingrown nail  excised. See procedure note. -Educated on post-procedure care including soaking. Written instructions provided and reviewed. -Patient to follow up in 2 weeks for nail check.  Procedure: Excision of Ingrown Toenail Location: Left 1st toe lateral nail borders. Anesthesia: Lidocaine 1% plain; 1.5 mL and Marcaine 0.5% plain; 1.5 mL, digital block. Skin Prep: Betadine. Dressing: Silvadene; telfa; dry, sterile, compression dressing. Technique: Following skin prep, the toe was exsanguinated and a tourniquet was secured at the base of the toe. The affected nail border was freed, split with a nail splitter, and excised. Chemical matrixectomy was then performed with phenol and irrigated out with alcohol. The tourniquet was then removed and sterile dressing applied. Disposition: Patient tolerated procedure well. Patient to return in 2 weeks for follow-up.   No follow-ups on file.

## 2022-02-26 ENCOUNTER — Encounter: Payer: BC Managed Care – PPO | Admitting: Nurse Practitioner

## 2022-03-05 DIAGNOSIS — D235 Other benign neoplasm of skin of trunk: Secondary | ICD-10-CM | POA: Diagnosis not present

## 2022-03-05 DIAGNOSIS — L821 Other seborrheic keratosis: Secondary | ICD-10-CM | POA: Diagnosis not present

## 2022-03-05 DIAGNOSIS — L814 Other melanin hyperpigmentation: Secondary | ICD-10-CM | POA: Diagnosis not present

## 2022-05-06 DIAGNOSIS — Z136 Encounter for screening for cardiovascular disorders: Secondary | ICD-10-CM | POA: Diagnosis not present

## 2022-05-06 DIAGNOSIS — Z23 Encounter for immunization: Secondary | ICD-10-CM | POA: Diagnosis not present

## 2022-05-06 DIAGNOSIS — Z1329 Encounter for screening for other suspected endocrine disorder: Secondary | ICD-10-CM | POA: Diagnosis not present

## 2022-05-06 DIAGNOSIS — Z8639 Personal history of other endocrine, nutritional and metabolic disease: Secondary | ICD-10-CM | POA: Diagnosis not present

## 2022-05-06 DIAGNOSIS — Z1331 Encounter for screening for depression: Secondary | ICD-10-CM | POA: Diagnosis not present

## 2022-05-06 DIAGNOSIS — Z131 Encounter for screening for diabetes mellitus: Secondary | ICD-10-CM | POA: Diagnosis not present

## 2022-05-06 DIAGNOSIS — Z1159 Encounter for screening for other viral diseases: Secondary | ICD-10-CM | POA: Diagnosis not present

## 2022-05-06 DIAGNOSIS — L680 Hirsutism: Secondary | ICD-10-CM | POA: Diagnosis not present

## 2022-05-06 DIAGNOSIS — Z114 Encounter for screening for human immunodeficiency virus [HIV]: Secondary | ICD-10-CM | POA: Diagnosis not present

## 2022-05-06 DIAGNOSIS — Z1330 Encounter for screening examination for mental health and behavioral disorders, unspecified: Secondary | ICD-10-CM | POA: Diagnosis not present

## 2022-05-06 DIAGNOSIS — Z0000 Encounter for general adult medical examination without abnormal findings: Secondary | ICD-10-CM | POA: Diagnosis not present

## 2022-07-16 IMAGING — US US BREAST*L* LIMITED INC AXILLA
1 series · 2 of 2 positions shown · non-contrast
Comparison: None.

CLINICAL DATA: 30-year-old female presents today with a palpable
lump in the upper LEFT breast.

EXAM:
ULTRASOUND OF THE LEFT BREAST

[Series 1: us breast*left* limited inc axilla · 0.06mm/px · 2 of 2 slices shown]
[im 1/2]
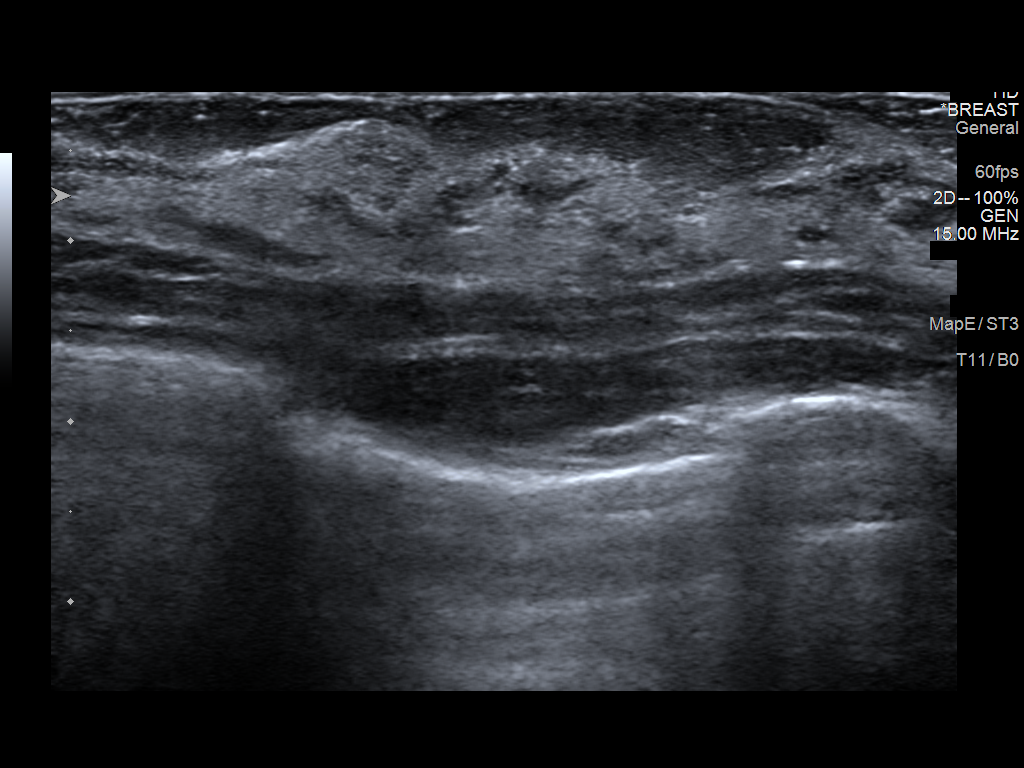
[im 2/2]
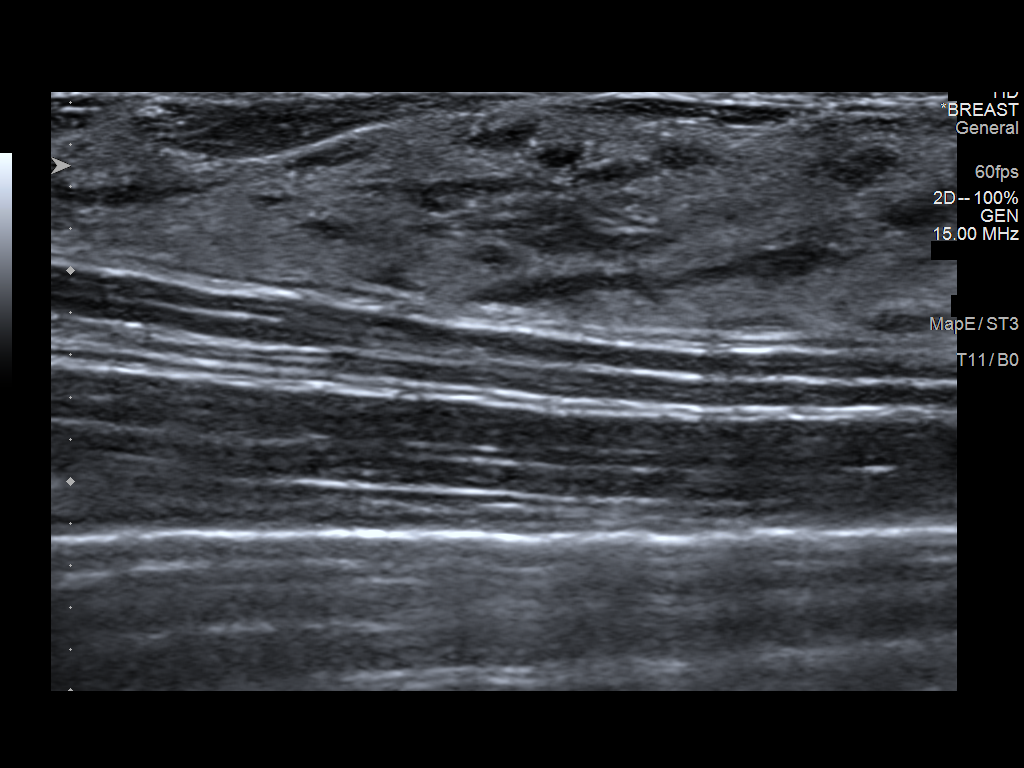

[2 of 2 positions shown; findings below may reference images not displayed]

FINDINGS: On physical exam, there is vague thickening within the upper LEFT
breast without evidence of fixed circumscribed mass.

Targeted ultrasound is performed, evaluating the upper LEFT breast
with particular attention to the 1 o'clock axis as directed by the
patient, showing only normal fibroglandular tissues and fat lobules
throughout. There is a ridge of normal dense fibroglandular tissue
at the 1 o'clock axis, corresponding to the palpable area of
concern.
IMPRESSION: No evidence of malignancy. Ridge of normal dense fibroglandular
tissue within the LEFT breast at the 1 o'clock axis, corresponding
to the palpable area of concern.

RECOMMENDATION:
1. Screening mammogram at age 41 unless there are persistent or
intervening clinical concerns. (Code:[id number]-[id number])
2. The patient was instructed to return sooner if the area that she
feels becomes larger and/or firmer to palpation, or if a new
palpable abnormality is identified in either breast.

I have discussed the findings and recommendations with the patient.
If applicable, a reminder letter will be sent to the patient
regarding the next appointment.

BI-RADS CATEGORY  1: Negative.

## 2023-03-06 DIAGNOSIS — L2089 Other atopic dermatitis: Secondary | ICD-10-CM | POA: Diagnosis not present

## 2023-03-06 DIAGNOSIS — L578 Other skin changes due to chronic exposure to nonionizing radiation: Secondary | ICD-10-CM | POA: Diagnosis not present

## 2023-03-06 DIAGNOSIS — L700 Acne vulgaris: Secondary | ICD-10-CM | POA: Diagnosis not present

## 2023-08-06 ENCOUNTER — Ambulatory Visit: Admitting: Podiatry
